# Patient Record
Sex: Female | Born: 1950 | Race: Black or African American | Hispanic: No | Marital: Single | Smoking: Never smoker
Health system: Southern US, Community
[De-identification: ages and names within clinical notes are randomized; demographics above are authoritative.]

## PROBLEM LIST (undated history)

## (undated) ENCOUNTER — Emergency Department (HOSPITAL_COMMUNITY): Admission: EM | Discharge status: Against Medical Advice | Payer: 59

## (undated) DIAGNOSIS — E7800 Pure hypercholesterolemia, unspecified: Secondary | ICD-10-CM

## (undated) DIAGNOSIS — I10 Essential (primary) hypertension: Secondary | ICD-10-CM

## (undated) DIAGNOSIS — I519 Heart disease, unspecified: Secondary | ICD-10-CM

## (undated) DIAGNOSIS — J449 Chronic obstructive pulmonary disease, unspecified: Secondary | ICD-10-CM

## (undated) HISTORY — PX: CATARACT EXTRACTION: SUR2

---

## 1998-02-03 ENCOUNTER — Ambulatory Visit (HOSPITAL_COMMUNITY): Admission: RE | Admit: 1998-02-03 | Discharge: 1998-02-03 | Payer: Self-pay | Admitting: *Deleted

## 2000-09-07 ENCOUNTER — Emergency Department (HOSPITAL_COMMUNITY): Admission: EM | Admit: 2000-09-07 | Discharge: 2000-09-07 | Payer: Self-pay | Admitting: Emergency Medicine

## 2000-09-07 ENCOUNTER — Encounter: Payer: Self-pay | Admitting: Emergency Medicine

## 2001-11-19 ENCOUNTER — Emergency Department (HOSPITAL_COMMUNITY): Admission: EM | Admit: 2001-11-19 | Discharge: 2001-11-19 | Payer: Self-pay | Admitting: Emergency Medicine

## 2002-01-01 ENCOUNTER — Ambulatory Visit (HOSPITAL_COMMUNITY): Admission: RE | Admit: 2002-01-01 | Discharge: 2002-01-01 | Payer: Self-pay | Admitting: Preventative Medicine

## 2002-01-01 ENCOUNTER — Encounter: Payer: Self-pay | Admitting: Preventative Medicine

## 2003-07-28 ENCOUNTER — Ambulatory Visit (HOSPITAL_COMMUNITY): Admission: RE | Admit: 2003-07-28 | Discharge: 2003-07-28 | Payer: Self-pay | Admitting: Family Medicine

## 2003-09-01 ENCOUNTER — Other Ambulatory Visit: Admission: RE | Admit: 2003-09-01 | Discharge: 2003-09-01 | Payer: Self-pay | Admitting: Family Medicine

## 2004-08-28 ENCOUNTER — Ambulatory Visit: Payer: Self-pay | Admitting: Family Medicine

## 2004-12-01 ENCOUNTER — Ambulatory Visit: Payer: Self-pay | Admitting: Family Medicine

## 2004-12-02 ENCOUNTER — Ambulatory Visit (HOSPITAL_COMMUNITY): Admission: RE | Admit: 2004-12-02 | Discharge: 2004-12-02 | Payer: Self-pay | Admitting: Family Medicine

## 2004-12-13 ENCOUNTER — Ambulatory Visit (HOSPITAL_COMMUNITY): Admission: RE | Admit: 2004-12-13 | Discharge: 2004-12-13 | Payer: Self-pay | Admitting: Family Medicine

## 2005-01-30 ENCOUNTER — Ambulatory Visit: Payer: Self-pay | Admitting: Family Medicine

## 2005-03-07 ENCOUNTER — Ambulatory Visit: Payer: Self-pay | Admitting: Family Medicine

## 2005-03-20 ENCOUNTER — Ambulatory Visit: Payer: Self-pay | Admitting: *Deleted

## 2005-04-02 ENCOUNTER — Ambulatory Visit: Payer: Self-pay | Admitting: *Deleted

## 2005-04-02 ENCOUNTER — Encounter (HOSPITAL_COMMUNITY): Admission: RE | Admit: 2005-04-02 | Discharge: 2005-04-02 | Payer: Self-pay | Admitting: *Deleted

## 2005-04-04 ENCOUNTER — Ambulatory Visit: Payer: Self-pay | Admitting: *Deleted

## 2005-04-11 ENCOUNTER — Ambulatory Visit: Payer: Self-pay | Admitting: *Deleted

## 2006-04-10 ENCOUNTER — Ambulatory Visit: Payer: Self-pay | Admitting: Family Medicine

## 2006-04-23 ENCOUNTER — Ambulatory Visit: Payer: Self-pay | Admitting: Family Medicine

## 2006-04-23 ENCOUNTER — Ambulatory Visit: Payer: Self-pay | Admitting: Cardiovascular Disease

## 2006-05-14 ENCOUNTER — Ambulatory Visit: Payer: Self-pay | Admitting: Cardiovascular Disease

## 2006-08-14 ENCOUNTER — Ambulatory Visit: Payer: Self-pay | Admitting: Cardiovascular Disease

## 2006-08-14 LAB — CONVERTED CEMR LAB
ALT: 27 units/L (ref 0–40)
Alkaline Phosphatase: 87 units/L (ref 39–117)
Bilirubin, Direct: 0.1 mg/dL (ref 0.0–0.3)
Cholesterol: 175 mg/dL (ref 0–200)
HDL: 39.2 mg/dL (ref >39.0)
Total CHOL/HDL Ratio: 4.5

## 2007-02-24 ENCOUNTER — Encounter: Admission: RE | Admit: 2007-02-24 | Discharge: 2007-02-24 | Payer: Self-pay | Admitting: Family Medicine

## 2007-02-28 ENCOUNTER — Encounter: Admission: RE | Admit: 2007-02-28 | Discharge: 2007-02-28 | Payer: Self-pay | Admitting: Family Medicine

## 2007-06-20 DIAGNOSIS — I10 Essential (primary) hypertension: Secondary | ICD-10-CM

## 2007-07-10 ENCOUNTER — Encounter: Payer: Self-pay | Admitting: Family Medicine

## 2010-04-27 ENCOUNTER — Ambulatory Visit (HOSPITAL_COMMUNITY): Admission: RE | Admit: 2010-04-27 | Discharge: 2010-04-27 | Payer: Self-pay | Admitting: Ophthalmology

## 2010-07-29 ENCOUNTER — Encounter: Payer: Self-pay | Admitting: Family Medicine

## 2010-08-08 NOTE — Letter (Signed)
Summary: rpc chart  rpc chart   Imported By: Curtis Sites 12/29/2009 11:36:18  _____________________________________________________________________  External Attachment:    Type:   Image     Comment:   External Document

## 2010-09-21 LAB — BASIC METABOLIC PANEL
Calcium: 9.2 mg/dL (ref 8.4–10.5)
GFR calc non Af Amer: >60 mL/min (ref >60)
Potassium: 3.5 mEq/L (ref 3.5–5.1)
Sodium: 140 mEq/L (ref 135–145)

## 2010-09-21 LAB — HEMOGLOBIN AND HEMATOCRIT, BLOOD: HCT: 40.1 % (ref 36.0–46.0)

## 2010-10-30 ENCOUNTER — Ambulatory Visit
Admission: RE | Admit: 2010-10-30 | Discharge: 2010-10-30 | Disposition: A | Discharge status: Home / Self Care | Payer: 59 | Source: Ambulatory Visit | Attending: Family Medicine | Admitting: Family Medicine

## 2010-10-30 ENCOUNTER — Other Ambulatory Visit: Payer: Self-pay | Admitting: Family Medicine

## 2010-10-30 DIAGNOSIS — R52 Pain, unspecified: Secondary | ICD-10-CM

## 2010-11-24 NOTE — Letter (Signed)
May 14, 2006    Syliva Overman, MD  [address]  [place], [place] [address]   RE:  Ariel, BROWNRIGG  MRN:  [id number]  /  DOB:  07/04/1951   Dear Dr. Lodema Hong:   I saw Ariel Martinez back in follow up today at the [facility].  As you know she is a very nice 60 year old African-American woman with  palpitations, hypertension and hypercholesterolemia who presented today for  follow up.  She has noticed a reduction in her palpitations since doubling  her Toprol and reducing her caffeine intake.  She has not had any chest  discomfort or shortness of breath. She has no other new complaints today.  Her palpitations occur most commonly with activity such as bending forward.  She continues to have them occasionally at night, but they are less frequent  than previously.   She was noted to be markedly hypertensive at the time of your office visit  back in mid October and you started her on antihypertensive therapy.  In  addition, I doubled her Toprol to treat her palpitations; and, she reports  better blood pressure control since these changes were made.   Her current medicines include Diovan HCT 320/12.5 daily, aspirin 81 mg daily  and Toprol 50 mg daily.   On exam today her blood pressure is 139/89 with a heart rate of 59.  Her  weight is 181 pounds. Respiratory rate is 16.   [name] is currently stable from a cardiovascular standpoint.  Her current  cardiovascular problems are as follows:  1. Palpitations; improved with caffeine reduction and doubling of Toprol      XL.  I think she has done well with these changes and would not      recommend any other diagnostic or therapeutic interventions today.  2. Hypertension.  She has had a good response to increasing her Toprol and      adding Diovan HCT.  Her blood pressure is borderline today, but I think      she has had a good overall response to this therapy and is much      improved from  previous.  If she has elevated blood pressures in the      future I think the addition of Norvasc would be a good choice for her      at your discretion.  3. Dyslipidemia.  The patient has a significantly elevated LDL cholesterol      at 151.  She reports being on Zocor in the past and tolerated this      well.  I would favor treating her for her dyslipidemia.  I have started      her on 40 mg of Zocor and have scheduled her for follow up lipids and      LFTs in 12 weeks.   Regarding follow up I will see her back on an as needed basis.  I will be in  contact with her when the results of her lipids and LFTs are completed, and  we will send these out to your office.   Thanks again for the opportunity to see [name].  Feel free to contact me  at anytime with questions regarding her care.    Sincerely,      Veverly Fells. Excell Seltzer, MD  Electronically Signed    MDC/MedQ  DD: 05/14/2006  DT: 05/14/2006  Job #: [id number]

## 2010-11-24 NOTE — Letter (Signed)
April 23, 2006     Milus Mallick. Lodema Hong, M.D.  [address]  [place], [place] [address]   RE:  Ariel, Martinez  MRN:  [id number]  /  DOB:  1950-10-19   Dear Dr. Lodema Hong:   It was my pleasure to see Ariel Martinez this morning as an outpatient at the  [facility].  As you know, she is a very nice 60 year old  African-American woman who presents for evaluation of palpitations and  hypertension.   Ariel Martinez has a longstanding history of palpitations.  She has had an  evaluation in the past that include a Holter Monitor back in September of  2006.  She had single PVCs that correlated with her symptoms.  She had no  atrial or ventricular sustained arrhythmias.  It was felt at that time that  she was having symptomatic PVCs.  She was started on Toprol but did not  tolerate the medication well, due to worsened palpitations.  Dr. Dorethea Clan  reduced her dose by half, and she has done relatively well since then.  Despite the Toprol, she continues to have palpitations that generally occur  at night.  They oftentimes wake her up from sleep.  She describes them as  feeling like her heart is pounding.  She does not have any associated  symptoms with her palpitations, other than feeling hot.  She also describes  rare chest pain that is non-exertional and is fleeting in nature.  It is  unchanged over time, and this is more of a chronic problem.  She has  undergone an evaluation for ischemia with a exercise Myoview that showed no  evidence of myocardial ischemia or infarction.  She had normal left  ventricular systolic function, as well as normal ventricular size.  This  study was performed on April 02, 2005.  Again, she has had no changes in  her symptoms since that time.   CURRENT MEDICATIONS:  1. Toprol XL, uncertain dose.  2. Aspirin 81 mg daily.  3. Diovan HCT 320/12.5 mg daily (This was just increased this morning).   ALLERGIES:  NONE.   PAST MEDICAL HISTORY:   Is pertinent for essential hypertension, dyslipidemia  and a partial hysterectomy back in 1986.   SOCIAL HISTORY:  The patient lives in [place].  She works in [employer]  as a Location manager.  She does not smoke cigarettes or drink alcohol.  She  drinks approximately 4 soft drinks daily that are caffeinated.  Before  exercise, she does some occasional walking but not on a regular basis.  She  is asymptomatic with her walking program.   FAMILY HISTORY:  Patient's mother died at age 75 of a stroke.  Father died  at age 37 for complications from asthma.  She has 3 brothers and 1 sister  and does not know there history well.  She does know that one of her  brothers has been hospitalized with hypertension.   REVIEW OF SYSTEMS:  A complete 12-point review of systems was performed.  Pertinent positives including fatigue, anxiety, ankle swelling, headaches.  All other systems were reviewed and are negative, except as described above.   PHYSICAL EXAMINATION:  GENERAL:  The patient is alert and oriented.  She is  in no acute distress.  VITAL SIGNS:  Her weight is 179 pounds.  Initial blood pressure was 152/96.  On my check, her blood pressure was 160/95 in her right arm and 156/95 in  the left arm.  Heart  rate is 78.  Respiratory rate is 12.  EYES:  Sclerae anicteric, conjunctivae pink.  ENT:  Moist oral mucosa.  Oropharynx is clear.  NECK:  Normal carotid upstrokes without bruits.  Jugular venous pressure is  normal.  LUNGS:  Clear to auscultation bilaterally.  CARDIOVASCULAR:  The apex is discreet and non-displaced.  HEART:  Regular rate and rhythm without murmurs or gallops.  ABDOMEN:  Soft, obese, nontender, no organomegaly, no abdominal bruits.  EXTREMITIES:  No clubbing, cyanosis or edema.  Peripheral pulses are 2+ and  equal throughout.  SKIN:  Warm and dry.  LYMPHATICS:  There is no lymphadenopathy.  NEUROLOGIC:  Motor strength is 5/5 and equal in the arms and legs   bilaterally.   EKG demonstrates normal sinus rhythm with nonspecific T-wave changes.   Recent laboratory data was reviewed from October 3rd.  This demonstrated a  normal creatinine of 0.7 and potassium of 4.1.  Patient's lipid panel showed  a cholesterol of 218 with an HDL of 45 and an LDL of 151.  Her triglyceride  level is 108.  Liver profile was within normal limits, and her TSH was  normal at 0.973.   ASSESSMENT:  Ariel Martinez is a 60 year old woman with the following  cardiovascular problems.  1. Palpitations.  I suspect her palpitations are caused by symptomatic      PVCs.  This was documented on her Holter Monitor last year, and her      symptoms persist.  She initially did not tolerate full-dose Toprol XL      and has been reduced to 1/2 tablet daily.  She has tolerated this      medication well but continues to have breakthrough symptoms.  I      suggested that she increase her Toprol back to one full pill daily, and      if this is tolerated, we may be able to titrate this medication in the      future.  I have also asked her to reduce her caffeine intake and switch      to de-caff soda.  I think this may have a significant effect on her      palpitations, as removing caffeine can produce good results.  I have      asked her to increase her exercise program and increase her fluid      intake as well.  I do not think she is at risk of any significant      arrhythmias in the setting of her normal left ventricular function and      no structural heart disease, as well as her reassuring Holter monitor      and absence of any history of syncope.  2. Hypertension.  You appropriately increased her anti-hypertensive      therapy this morning with doubling her Diovan HCT.  I have doubled her      Toprol for her palpitations as above.  I would like to see her back in      a few weeks to see how she has responded to these changes.  If she     continues to have significant hypertension, I  think that Norvasc would      be a good choice of additional therapy if necessary.  I did not want to      make this addition today, as we have already made the changes as      described above.  3. Dyslipidemia.  I think treatment  of her LDL cholesterol is warranted,      as her LDL is 151.  I will review this with her upon her followup visit      and likely add Statin therapy at that time.   Dr. Lodema Hong, thank you once again for allowing me to participate in the care  of Ariel Martinez.  Please feel free to contact me at any time with questions  regarding her care.    Sincerely,      Veverly Fells. Excell Seltzer, MD    MDC/MedQ  /  Job #:  [id number]  DD:  04/23/2006 / DT:  04/24/2006

## 2012-05-23 ENCOUNTER — Other Ambulatory Visit: Payer: Self-pay | Admitting: Family Medicine

## 2012-05-23 ENCOUNTER — Ambulatory Visit
Admission: RE | Admit: 2012-05-23 | Discharge: 2012-05-23 | Disposition: A | Discharge status: Home / Self Care | Payer: 59 | Source: Ambulatory Visit | Attending: Family Medicine | Admitting: Family Medicine

## 2012-05-23 DIAGNOSIS — M25562 Pain in left knee: Secondary | ICD-10-CM

## 2014-03-10 ENCOUNTER — Other Ambulatory Visit: Payer: Self-pay | Admitting: Family Medicine

## 2014-03-10 DIAGNOSIS — M545 Low back pain, unspecified: Secondary | ICD-10-CM

## 2014-03-10 DIAGNOSIS — Z9181 History of falling: Secondary | ICD-10-CM

## 2014-03-10 DIAGNOSIS — R0781 Pleurodynia: Secondary | ICD-10-CM

## 2014-03-11 ENCOUNTER — Ambulatory Visit
Admission: RE | Admit: 2014-03-11 | Discharge: 2014-03-11 | Disposition: A | Discharge status: Home / Self Care | Payer: 59 | Source: Ambulatory Visit | Attending: Family Medicine | Admitting: Family Medicine

## 2014-03-11 DIAGNOSIS — Z9181 History of falling: Secondary | ICD-10-CM

## 2014-03-11 DIAGNOSIS — R0781 Pleurodynia: Secondary | ICD-10-CM

## 2014-03-11 DIAGNOSIS — M545 Low back pain, unspecified: Secondary | ICD-10-CM

## 2014-04-21 ENCOUNTER — Other Ambulatory Visit: Payer: Self-pay | Admitting: Family Medicine

## 2014-05-25 ENCOUNTER — Other Ambulatory Visit: Payer: Self-pay | Admitting: Internal Medicine

## 2014-05-25 ENCOUNTER — Ambulatory Visit (INDEPENDENT_AMBULATORY_CARE_PROVIDER_SITE_OTHER): Payer: 59 | Admitting: Urology

## 2014-05-25 ENCOUNTER — Other Ambulatory Visit: Payer: Self-pay | Admitting: Urology

## 2014-05-25 ENCOUNTER — Ambulatory Visit (HOSPITAL_COMMUNITY)
Admission: RE | Admit: 2014-05-25 | Discharge: 2014-05-25 | Disposition: A | Discharge status: Home / Self Care | Payer: 59 | Source: Ambulatory Visit | Attending: Urology | Admitting: Urology

## 2014-05-25 DIAGNOSIS — N200 Calculus of kidney: Secondary | ICD-10-CM

## 2014-05-25 DIAGNOSIS — M5136 Other intervertebral disc degeneration, lumbar region: Secondary | ICD-10-CM | POA: Insufficient documentation

## 2014-05-25 DIAGNOSIS — R109 Unspecified abdominal pain: Secondary | ICD-10-CM | POA: Diagnosis present

## 2014-06-20 ENCOUNTER — Encounter (HOSPITAL_COMMUNITY): Payer: Self-pay | Admitting: *Deleted

## 2014-06-20 ENCOUNTER — Inpatient Hospital Stay (HOSPITAL_COMMUNITY)
Admission: EM | Admit: 2014-06-20 | Discharge: 2014-06-22 | DRG: 189 | Disposition: A | Discharge status: Home / Self Care | Payer: 59 | Attending: Internal Medicine | Admitting: Internal Medicine

## 2014-06-20 ENCOUNTER — Inpatient Hospital Stay (HOSPITAL_COMMUNITY): Payer: 59

## 2014-06-20 ENCOUNTER — Emergency Department (HOSPITAL_COMMUNITY): Payer: 59

## 2014-06-20 DIAGNOSIS — J9601 Acute respiratory failure with hypoxia: Secondary | ICD-10-CM | POA: Diagnosis present

## 2014-06-20 DIAGNOSIS — J209 Acute bronchitis, unspecified: Secondary | ICD-10-CM

## 2014-06-20 DIAGNOSIS — E1165 Type 2 diabetes mellitus with hyperglycemia: Secondary | ICD-10-CM | POA: Diagnosis present

## 2014-06-20 DIAGNOSIS — R0602 Shortness of breath: Secondary | ICD-10-CM

## 2014-06-20 DIAGNOSIS — J441 Chronic obstructive pulmonary disease with (acute) exacerbation: Secondary | ICD-10-CM | POA: Diagnosis present

## 2014-06-20 DIAGNOSIS — Z79899 Other long term (current) drug therapy: Secondary | ICD-10-CM | POA: Diagnosis not present

## 2014-06-20 DIAGNOSIS — E876 Hypokalemia: Secondary | ICD-10-CM | POA: Diagnosis present

## 2014-06-20 DIAGNOSIS — R739 Hyperglycemia, unspecified: Secondary | ICD-10-CM

## 2014-06-20 DIAGNOSIS — R0600 Dyspnea, unspecified: Secondary | ICD-10-CM | POA: Diagnosis present

## 2014-06-20 DIAGNOSIS — R9431 Abnormal electrocardiogram [ECG] [EKG]: Secondary | ICD-10-CM | POA: Diagnosis present

## 2014-06-20 DIAGNOSIS — F419 Anxiety disorder, unspecified: Secondary | ICD-10-CM | POA: Diagnosis present

## 2014-06-20 DIAGNOSIS — I10 Essential (primary) hypertension: Secondary | ICD-10-CM | POA: Diagnosis present

## 2014-06-20 DIAGNOSIS — R0789 Other chest pain: Secondary | ICD-10-CM

## 2014-06-20 DIAGNOSIS — J9600 Acute respiratory failure, unspecified whether with hypoxia or hypercapnia: Secondary | ICD-10-CM | POA: Diagnosis present

## 2014-06-20 HISTORY — DX: Pure hypercholesterolemia, unspecified: E78.00

## 2014-06-20 HISTORY — DX: Essential (primary) hypertension: I10

## 2014-06-20 HISTORY — DX: Heart disease, unspecified: I51.9

## 2014-06-20 LAB — COMPREHENSIVE METABOLIC PANEL
ALBUMIN: 4.1 g/dL (ref 3.5–5.2)
ALK PHOS: 111 U/L (ref 39–117)
ALT: 17 U/L (ref 0–35)
ANION GAP: 17 — AB (ref 5–15)
AST: 23 U/L (ref 0–37)
BUN: 13 mg/dL (ref 6–23)
CALCIUM: 9.5 mg/dL (ref 8.4–10.5)
CHLORIDE: 97 meq/L (ref 96–112)
CO2: 25 mEq/L (ref 19–32)
Creatinine, Ser: 0.66 mg/dL (ref 0.50–1.10)
GFR calc Af Amer: >90 mL/min (ref >90)
GFR calc non Af Amer: >90 mL/min (ref >90)
Glucose, Bld: 194 mg/dL — ABNORMAL HIGH (ref 70–99)
POTASSIUM: 3.4 meq/L — AB (ref 3.7–5.3)
SODIUM: 139 meq/L (ref 137–147)
Total Bilirubin: 0.2 mg/dL — ABNORMAL LOW (ref 0.3–1.2)
Total Protein: 8.6 g/dL — ABNORMAL HIGH (ref 6.0–8.3)

## 2014-06-20 LAB — MRSA PCR SCREENING: MRSA BY PCR: NEGATIVE

## 2014-06-20 LAB — CBG MONITORING, ED
Glucose-Capillary: 370 mg/dL — ABNORMAL HIGH (ref 70–99)
Glucose-Capillary: 344 mg/dL — ABNORMAL HIGH (ref 70–99)

## 2014-06-20 LAB — CBC WITH DIFFERENTIAL/PLATELET
BASOS PCT: 1 % (ref 0–1)
Basophils Absolute: 0.1 10*3/uL (ref 0.0–0.1)
Eosinophils Absolute: 0.9 10*3/uL — ABNORMAL HIGH (ref 0.0–0.7)
Eosinophils Relative: 8 % — ABNORMAL HIGH (ref 0–5)
HEMATOCRIT: 44 % (ref 36.0–46.0)
Hemoglobin: 14.5 g/dL (ref 12.0–15.0)
Lymphocytes Relative: 31 % (ref 12–46)
Lymphs Abs: 3.5 10*3/uL (ref 0.7–4.0)
MCH: 30.9 pg (ref 26.0–34.0)
MCHC: 33 g/dL (ref 30.0–36.0)
MCV: 93.6 fL (ref 78.0–100.0)
MONOS PCT: 6 % (ref 3–12)
Monocytes Absolute: 0.7 10*3/uL (ref 0.1–1.0)
NEUTROS PCT: 54 % (ref 43–77)
Neutro Abs: 6.4 10*3/uL (ref 1.7–7.7)
PLATELETS: 265 10*3/uL (ref 150–400)
RBC: 4.7 MIL/uL (ref 3.87–5.11)
RDW: 12.9 % (ref 11.5–15.5)
WBC: 11.5 10*3/uL — ABNORMAL HIGH (ref 4.0–10.5)

## 2014-06-20 LAB — GLUCOSE, CAPILLARY
Glucose-Capillary: 214 mg/dL — ABNORMAL HIGH (ref 70–99)
Glucose-Capillary: 196 mg/dL — ABNORMAL HIGH (ref 70–99)

## 2014-06-20 LAB — HEMOGLOBIN A1C
Hgb A1c MFr Bld: 6.5 % — ABNORMAL HIGH (ref <5.7)
Mean Plasma Glucose: 140 mg/dL — ABNORMAL HIGH (ref <117)

## 2014-06-20 LAB — TROPONIN I: Troponin I: <0.3 ng/mL (ref <0.30)

## 2014-06-20 LAB — PROCALCITONIN: Procalcitonin: <0.1 ng/mL

## 2014-06-20 LAB — PRO B NATRIURETIC PEPTIDE: PRO B NATRI PEPTIDE: 13.6 pg/mL (ref 0–125)

## 2014-06-20 LAB — STREP PNEUMONIAE URINARY ANTIGEN: Strep Pneumo Urinary Antigen: NEGATIVE

## 2014-06-20 MED ORDER — ENOXAPARIN SODIUM 40 MG/0.4ML ~~LOC~~ SOLN
40.0000 mg | SUBCUTANEOUS | Status: DC
  Administered 2014-06-20 – 2014-06-21 (×2): 40 mg via SUBCUTANEOUS
  Filled 2014-06-20 (×4): qty 0.4

## 2014-06-20 MED ORDER — ACETAMINOPHEN 325 MG PO TABS: 650.0000 mg | ORAL_TABLET | Freq: Four times a day (QID) | ORAL | Status: DC | PRN

## 2014-06-20 MED ORDER — ALBUTEROL (5 MG/ML) CONTINUOUS INHALATION SOLN: 10.0000 mg/h | INHALATION_SOLUTION | RESPIRATORY_TRACT | Status: DC

## 2014-06-20 MED ORDER — LEVOFLOXACIN IN D5W 750 MG/150ML IV SOLN
750.0000 mg | Freq: Once | INTRAVENOUS | Status: AC
  Administered 2014-06-20: 750 mg via INTRAVENOUS
  Filled 2014-06-20: qty 150

## 2014-06-20 MED ORDER — AMLODIPINE-OLMESARTAN 10-40 MG PO TABS: 1.0000 | ORAL_TABLET | Freq: Every day | ORAL | Status: DC

## 2014-06-20 MED ORDER — MAGNESIUM SULFATE 2 GM/50ML IV SOLN
2.0000 g | Freq: Once | INTRAVENOUS | Status: AC
  Administered 2014-06-20: 2 g via INTRAVENOUS
  Filled 2014-06-20: qty 50

## 2014-06-20 MED ORDER — METHYLPREDNISOLONE SODIUM SUCC 125 MG IJ SOLR
80.0000 mg | Freq: Four times a day (QID) | INTRAMUSCULAR | Status: DC
  Administered 2014-06-20 – 2014-06-21 (×5): 80 mg via INTRAVENOUS
  Filled 2014-06-20 (×6): qty 2

## 2014-06-20 MED ORDER — POTASSIUM CHLORIDE 10 MEQ/100ML IV SOLN
10.0000 meq | INTRAVENOUS | Status: AC
  Administered 2014-06-20 (×2): 10 meq via INTRAVENOUS
  Filled 2014-06-20 (×2): qty 100

## 2014-06-20 MED ORDER — IRBESARTAN 300 MG PO TABS
300.0000 mg | ORAL_TABLET | Freq: Every day | ORAL | Status: DC
  Administered 2014-06-20: 300 mg via ORAL
  Filled 2014-06-20 (×2): qty 1

## 2014-06-20 MED ORDER — POTASSIUM CHLORIDE CRYS ER 10 MEQ PO TBCR
10.0000 meq | EXTENDED_RELEASE_TABLET | Freq: Every day | ORAL | Status: DC
  Administered 2014-06-20 – 2014-06-22 (×3): 10 meq via ORAL
  Filled 2014-06-20 (×3): qty 1

## 2014-06-20 MED ORDER — TORSEMIDE 20 MG PO TABS
20.0000 mg | ORAL_TABLET | Freq: Every day | ORAL | Status: DC
  Administered 2014-06-20 – 2014-06-21 (×2): 20 mg via ORAL
  Filled 2014-06-20 (×2): qty 1

## 2014-06-20 MED ORDER — METOPROLOL SUCCINATE ER 25 MG PO TB24
25.0000 mg | ORAL_TABLET | Freq: Every day | ORAL | Status: DC
  Administered 2014-06-20 – 2014-06-22 (×3): 25 mg via ORAL
  Filled 2014-06-20 (×3): qty 1

## 2014-06-20 MED ORDER — ONDANSETRON HCL 4 MG/2ML IJ SOLN: 4.0000 mg | Freq: Four times a day (QID) | INTRAMUSCULAR | Status: DC | PRN

## 2014-06-20 MED ORDER — BUDESONIDE 0.25 MG/2ML IN SUSP
0.2500 mg | Freq: Two times a day (BID) | RESPIRATORY_TRACT | Status: DC
  Administered 2014-06-20 – 2014-06-22 (×5): 0.25 mg via RESPIRATORY_TRACT
  Filled 2014-06-20 (×5): qty 2

## 2014-06-20 MED ORDER — MORPHINE SULFATE 2 MG/ML IJ SOLN
2.0000 mg | Freq: Once | INTRAMUSCULAR | Status: AC
  Administered 2014-06-20: 2 mg via INTRAVENOUS
  Filled 2014-06-20: qty 1

## 2014-06-20 MED ORDER — MORPHINE SULFATE 2 MG/ML IJ SOLN: 2.0000 mg | INTRAMUSCULAR | Status: DC | PRN

## 2014-06-20 MED ORDER — ONDANSETRON HCL 4 MG PO TABS: 4.0000 mg | ORAL_TABLET | Freq: Four times a day (QID) | ORAL | Status: DC | PRN

## 2014-06-20 MED ORDER — METHYLPREDNISOLONE SODIUM SUCC 125 MG IJ SOLR
125.0000 mg | Freq: Once | INTRAMUSCULAR | Status: AC
  Administered 2014-06-20: 125 mg via INTRAVENOUS
  Filled 2014-06-20: qty 2

## 2014-06-20 MED ORDER — LORAZEPAM 0.5 MG PO TABS
0.5000 mg | ORAL_TABLET | Freq: Once | ORAL | Status: AC
  Administered 2014-06-20: 0.5 mg via ORAL
  Filled 2014-06-20: qty 1

## 2014-06-20 MED ORDER — SIMVASTATIN 20 MG PO TABS
20.0000 mg | ORAL_TABLET | Freq: Every day | ORAL | Status: DC
  Administered 2014-06-20 – 2014-06-21 (×2): 20 mg via ORAL
  Filled 2014-06-20: qty 1
  Filled 2014-06-20: qty 2
  Filled 2014-06-20: qty 1
  Filled 2014-06-20: qty 2

## 2014-06-20 MED ORDER — INSULIN ASPART 100 UNIT/ML ~~LOC~~ SOLN
0.0000 [IU] | Freq: Three times a day (TID) | SUBCUTANEOUS | Status: DC
Start: 2014-06-20 — End: 2014-06-21
  Administered 2014-06-20: 9 [IU] via SUBCUTANEOUS
  Administered 2014-06-20: 3 [IU] via SUBCUTANEOUS
  Administered 2014-06-21: 2 [IU] via SUBCUTANEOUS
  Filled 2014-06-20: qty 1

## 2014-06-20 MED ORDER — LEVOFLOXACIN IN D5W 750 MG/150ML IV SOLN
750.0000 mg | INTRAVENOUS | Status: DC
  Administered 2014-06-21: 750 mg via INTRAVENOUS
  Filled 2014-06-20 (×2): qty 150

## 2014-06-20 MED ORDER — ASPIRIN 325 MG PO TABS
325.0000 mg | ORAL_TABLET | Freq: Every day | ORAL | Status: DC
  Administered 2014-06-20 – 2014-06-22 (×3): 325 mg via ORAL
  Filled 2014-06-20 (×3): qty 1

## 2014-06-20 MED ORDER — CETYLPYRIDINIUM CHLORIDE 0.05 % MT LIQD
7.0000 mL | Freq: Two times a day (BID) | OROMUCOSAL | Status: DC
  Administered 2014-06-20 – 2014-06-22 (×4): 7 mL via OROMUCOSAL

## 2014-06-20 MED ORDER — IOHEXOL 350 MG/ML SOLN
100.0000 mL | Freq: Once | INTRAVENOUS | Status: AC | PRN
  Administered 2014-06-20: 100 mL via INTRAVENOUS

## 2014-06-20 MED ORDER — ACETAMINOPHEN 650 MG RE SUPP: 650.0000 mg | Freq: Four times a day (QID) | RECTAL | Status: DC | PRN

## 2014-06-20 MED ORDER — IPRATROPIUM-ALBUTEROL 0.5-2.5 (3) MG/3ML IN SOLN
3.0000 mL | RESPIRATORY_TRACT | Status: DC
  Administered 2014-06-20 – 2014-06-21 (×8): 3 mL via RESPIRATORY_TRACT
  Filled 2014-06-20 (×8): qty 3

## 2014-06-20 MED ORDER — MAGNESIUM SULFATE 2 GM/50ML IV SOLN
2.0000 g | Freq: Once | INTRAVENOUS | Status: DC
  Filled 2014-06-20: qty 50

## 2014-06-20 MED ORDER — SODIUM CHLORIDE 0.9 % IV SOLN
Freq: Once | INTRAVENOUS | Status: AC
  Administered 2014-06-20: 09:00:00 via INTRAVENOUS

## 2014-06-20 MED ORDER — AMLODIPINE BESYLATE 10 MG PO TABS
10.0000 mg | ORAL_TABLET | Freq: Every day | ORAL | Status: DC
  Administered 2014-06-20: 10 mg via ORAL
  Filled 2014-06-20: qty 1

## 2014-06-20 MED ORDER — SODIUM CHLORIDE 0.9 % IJ SOLN
3.0000 mL | Freq: Two times a day (BID) | INTRAMUSCULAR | Status: DC
  Administered 2014-06-21 – 2014-06-22 (×3): 3 mL via INTRAVENOUS

## 2014-06-20 MED ORDER — ALBUTEROL (5 MG/ML) CONTINUOUS INHALATION SOLN
10.0000 mg/h | INHALATION_SOLUTION | RESPIRATORY_TRACT | Status: DC
  Administered 2014-06-20: 10 mg/h via RESPIRATORY_TRACT
  Filled 2014-06-20: qty 20

## 2014-06-20 MED ORDER — ALBUTEROL (5 MG/ML) CONTINUOUS INHALATION SOLN
10.0000 mg/h | INHALATION_SOLUTION | Freq: Once | RESPIRATORY_TRACT | Status: AC
  Administered 2014-06-20: 10 mg/h via RESPIRATORY_TRACT
  Filled 2014-06-20: qty 20

## 2014-06-20 NOTE — ED Notes (Signed)
Notified Dr Lynelle DoctorKnapp that pt is still tripoding and not moving much air post neb and still fells like she cannot breathe. EDP at bedside

## 2014-06-20 NOTE — Progress Notes (Signed)
Pt tolerating BiPap at this time.

## 2014-06-20 NOTE — H&P (Signed)
History and Physical  Ariel InaBrenda A Martinez WGN:[id number] DOB: [birth year] DOA: 06/20/2014   PCP: Evlyn CourierHILL,GERALD K, MD   Chief Complaint: dyspnea  HPI:  63 y/o female with hx of HTN, HLD presents with one-week history of shortness of breath that has progressively worsened in the past 24 hours. The patient had a colonoscopy performed on 06/14/2014. She began developing shortness of breath on 06/15/2014. She went to see her primary care provider who gave her some anti-tussive meds. The patient's cough improved, but she became progressively shortness of breath as the week went along. The patient was called and an albuterol inhaler on the day prior to admission. She used it, and it did not help. Her breathing got worse. As a result the patient came to the emergency department. The patient was noted to be hypoxemic and was placed on a nonrebreather. BiPAP was attempted, but the patient was not able to tolerate it. After Solu-Medrol and 1 hour-long albuterol nebulizer, the patient is feeling somewhat better, but she remains quite dyspneic. The patient also began complaining of chest pressure that began in the past 24 hours 100 breathing got worse. She denies any radiation, nausea, vomiting, diarrhea, abdominal pain, dysuria. She has not had any fevers or chills or hemoptysis. The patient has never smoked in her life. She has not had any new changes to her medications except for the cough medication and albuterol. The patient states that she was told that she had asthma in the distant past and has used steroids in the past with similar episodes of respiratory failure. Assessment/Plan: Acute respiratory failure -Suspect underlying undiagnosed obstructive lung disease -IV Solu-Medrol -Aerosolized albuterol and Atrovent every 4 hours -Intravenous levofloxacin -Pulmonary hygiene -Pulmonary consultation if no improvement -Intravenous morphine for anxiety and chest discomfort when necessary Abnormal chest  x-ray -Suspect the patient may be developing CAP given increased right lower lobe opacity and leukocytosis -Levofloxacin started -Urine legionella antigen, urine Streptococcus pneumoniae antigen Chest pain with abnormal EKG -Cycle troponins -Start aspirin -Continue metoprolol succinate Hypertension -Continue amlodipine and ARB -Continue metoprolol succinate Lower extremity edema -Venous duplex rule out DVT -Continue home dose torsemide -ProBNP 13.6 Hypokalemia -Replete -Patient was given 2 g magnesium IV in the emergency department -Check magnesium in the morning Hyperglycemia  -NovoLog sliding scale  -Hemoglobin A1c       Past Medical History  Diagnosis Date  . Hypertension   . High cholesterol   . Heart disease    Past Surgical History  Procedure Laterality Date  . Cataract extraction     Social History:  reports that she has never smoked. She does not have any smokeless tobacco history on file. She reports that she does not drink alcohol or use illicit drugs.   History reviewed. No pertinent family history.   No Known Allergies    Prior to Admission medications   Medication Sig Start Date End Date Taking? Authorizing Provider  albuterol (PROVENTIL HFA;VENTOLIN HFA) 108 (90 BASE) MCG/ACT inhaler Inhale 2 puffs into the lungs every 4 (four) hours as needed for wheezing or shortness of breath.   Yes Historical Provider, MD  AZOR 10-40 MG per tablet Take 1 tablet by mouth daily. 03/24/14  Yes Historical Provider, MD  ibuprofen (ADVIL,MOTRIN) 200 MG tablet Take 200 mg by mouth every 6 (six) hours as needed (for pain.).   Yes Historical Provider, MD  metoprolol succinate (TOPROL-XL) 25 MG 24 hr tablet Take 25 mg by mouth daily. 04/09/14  Yes Historical Provider, MD  simvastatin (ZOCOR) 20 MG tablet Take 20 mg by mouth daily.   Yes Historical Provider, MD  KLOR-CON M10 10 MEQ tablet Take 10 mEq by mouth daily. 03/24/14   Historical Provider, MD  torsemide (DEMADEX) 20  MG tablet Take 20 mg by mouth daily. 04/09/14   Historical Provider, MD    Review of Systems:  Constitutional:  No weight loss, night sweats,  Head&Eyes: No headache.  No vision loss. ENT:  No Difficulty swallowing,Tooth/dental problems,Sore throat,  Cardio-vascular:  No  Orthopnea, PND, swelling in lower extremities,  dizziness, palpitations  GI:  No  abdominal pain, nausea, vomiting, diarrhea, loss of appetite, hematochezia, melena Resp:  No coughing up of blood .No wheezing. Skin:  no rash or lesions.  GU:  no dysuria, change in color of urine, no urgency or frequency Musculoskeletal:  No joint pain or swelling. No decreased range of motion. No back pain.  Psych:  No change in mood or affect.  Neurologic: No headache, no dysesthesia, no focal weakness, no vision loss. No syncope  Physical Exam: Filed Vitals:   06/20/14 0850 06/20/14 0910 06/20/14 0925 06/20/14 0927  BP: 125/71  152/70   Pulse: 124 131 122   Temp:      TempSrc:      Resp: 16 31 27    SpO2: 93% 100% 98% 99%   General:  A&O x 3,  Pleasant/cooperative, mild extremis Head/Eye: No conjunctival hemorrhage, no icterus, [place]/AT, No nystagmus ENT:  No icterus,  No thrush, good dentition, no pharyngeal exudate Neck:  No masses, no lymphadenpathy, no bruits CV:  RRR, no rub, no gallop, no S3 Lung:  Diffuse wheezing bilateral with bibasilar rales. Abdomen: soft/NT, +BS, nondistended, no peritoneal signs Ext: No cyanosis, No rashes, No petechiae, No lymphangitis, 1+LE edema   Labs on Admission:  Basic Metabolic Panel:  Recent Labs Lab 06/20/14 0645  NA 139  K 3.4*  CL 97  CO2 25  GLUCOSE 194*  BUN 13  CREATININE 0.66  CALCIUM 9.5   Liver Function Tests:  Recent Labs Lab 06/20/14 0645  AST 23  ALT 17  ALKPHOS 111  BILITOT 0.2*  PROT 8.6*  ALBUMIN 4.1   No results for input(s): LIPASE, AMYLASE in the last 168 hours. No results for input(s): AMMONIA in the last 168 hours. CBC:  Recent  Labs Lab 06/20/14 0645  WBC 11.5*  NEUTROABS 6.4  HGB 14.5  HCT 44.0  MCV 93.6  PLT 265   Cardiac Enzymes:  Recent Labs Lab 06/20/14 0645  TROPONINI <0.30   BNP: Invalid input(s): POCBNP CBG: No results for input(s): GLUCAP in the last 168 hours.  Radiological Exams on Admission: Dg Chest Port 1 View  06/20/2014   CLINICAL DATA:  63 year old female with shortness of breath  EXAM: PORTABLE CHEST - 1 VIEW  COMPARISON:  Prior chest x-ray 03/11/2014  FINDINGS: Cardiac and mediastinal contours are within normal limits. Very low inspiratory volumes with increasing patchy opacity in the bilateral bases. Similar but less conspicuous opacity was present in the right lung base on the prior chest x-ray. There is mild pulmonary vascular congestion bordering on edema. No pneumothorax. No definite pleural effusion. No acute osseous abnormality.  IMPRESSION: 1. Low inspiratory volumes with patchy bibasilar opacities which may reflect atelectasis superimposed on underlying scarring or infiltrates. Given the presence of a mild bibasilar scarring on prior imaging, atelectasis superimposed on chronic changes is favored. 2. Pulmonary vascular congestion bordering on mild edema.   Electronically Signed   By:  Malachy Moan M.D.   On: 06/20/2014 07:42    EKG: Independently reviewed. Sinus tachycardia, ST depression V3-V6   Time spent:70 minutes Code Status:   FULL Family Communication:   Daughter updated at bedside   Elandra Powell, DO  [facility] Pager [phone]  If 7PM-7AM, please contact night-coverage [email or web address] Password [facility] 06/20/2014, 9:48 AM

## 2014-06-20 NOTE — ED Notes (Signed)
All of pts belongings have been given to pts friend with pts agreement, including phone, purse, meds, and clothing.

## 2014-06-20 NOTE — ED Notes (Signed)
On Monday pt states she had a colonoscopy and when she woke up she was very hoarse, throat was dry, and it was hard to talk. Pt states she saw her doctor on Tuesday who prescribed her a preventil inhaler for the hoarseness and productive cough (yellow sputum). Respiratory distress has gotten worse and inhaler has not helped per pt. EMS reports upon their arrival she was outside of her home in moderate distress, O2 99% on room air , with wheezing in all fields. Pt given 10mg  of albuterol, 1mg  atrovent, and 125mg  solu-medrol. Pt arrived with audible expiratory and inspiratory wheezing.

## 2014-06-20 NOTE — ED Notes (Signed)
RT notified about bipap

## 2014-06-20 NOTE — ED Notes (Signed)
Pt continues to c/o CP and difficulty breathing. Charge RN notified and advised to move pt to Res B. RT notified to bring Bipap. Pt A&O

## 2014-06-20 NOTE — ED Provider Notes (Signed)
CSN: [id number]     Arrival date & time 06/20/14  [id number] History  First MD Initiated Contact with Patient 06/20/14 [phone]     Chief Complaint  Patient presents with  . Respiratory Distress   Patient is a 63 y.o. female presenting with shortness of breath. The history is provided by the patient.  Shortness of Breath Severity:  Severe Onset quality:  Gradual Duration:  1 week Timing:  Constant Progression:  Worsening Chronicity:  Recurrent Relieved by:  Nothing Worsened by:  Activity Ineffective treatments:  Inhaler Associated symptoms: cough, sputum production and wheezing   Associated symptoms: no abdominal pain, no chest pain, no fever and no vomiting   Pt went to her doctors office on Monday. She was given some cough medications.  She asked if she needed steroids but her doctor did not think she needed them at that time. Throughout the week it got worse and she started getting more short of breath.  She called her doctor and was given an inhaler on Saturday but she thinks if anything she got worse.  No past medical history on file. No past surgical history on file. No family history on file. History  Substance Use Topics  . Smoking status: Not on file  . Smokeless tobacco: Not on file  . Alcohol Use: Not on file   OB History    No data available     Review of Systems  Constitutional: Negative for fever.  Respiratory: Positive for cough, sputum production, shortness of breath and wheezing.   Cardiovascular: Negative for chest pain.  Gastrointestinal: Negative for vomiting and abdominal pain.  All other systems reviewed and are negative.     Allergies  Review of patient's allergies indicates no known allergies.  Home Medications   Prior to Admission medications   Medication Sig Start Date End Date Taking? Authorizing Provider  albuterol (PROVENTIL HFA;VENTOLIN HFA) 108 (90 BASE) MCG/ACT inhaler Inhale 2 puffs into the lungs every 4 (four) hours as needed for wheezing  or shortness of breath.   Yes Historical Provider, MD  AZOR 10-40 MG per tablet Take 1 tablet by mouth daily. 03/24/14  Yes Historical Provider, MD  ibuprofen (ADVIL,MOTRIN) 200 MG tablet Take 200 mg by mouth every 6 (six) hours as needed (for pain.).   Yes Historical Provider, MD  metoprolol succinate (TOPROL-XL) 25 MG 24 hr tablet Take 25 mg by mouth daily. 04/09/14  Yes Historical Provider, MD  simvastatin (ZOCOR) 20 MG tablet Take 20 mg by mouth daily.   Yes Historical Provider, MD  KLOR-CON M10 10 MEQ tablet Take 10 mEq by mouth daily. 03/24/14   Historical Provider, MD  torsemide (DEMADEX) 20 MG tablet Take 20 mg by mouth daily. 04/09/14   Historical Provider, MD   BP 164/89 mmHg  Pulse 128  Temp(Src) 97.7 F (36.5 C) (Axillary)  Resp 16  SpO2 94% Physical Exam  Constitutional: She appears well-developed. She appears distressed.  HENT:  Head: Normocephalic and atraumatic.  Right Ear: External ear normal.  Left Ear: External ear normal.  Eyes: Conjunctivae are normal. Right eye exhibits no discharge. Left eye exhibits no discharge. No scleral icterus.  Neck: Neck supple. No JVD present. No tracheal deviation present.  Cardiovascular: Regular rhythm and intact distal pulses.  Tachycardia present.   Pulmonary/Chest: Accessory muscle usage present. No stridor. Tachypnea noted. She has wheezes. She has no rales.  Abdominal: Soft. Bowel sounds are normal. She exhibits no distension. There is no tenderness. There is no rebound and  no guarding.  Musculoskeletal: She exhibits edema (trace edema ankles). She exhibits no tenderness.  Neurological: She is alert. She has normal strength. No cranial nerve deficit (no facial droop, extraocular movements intact, no slurred speech) or sensory deficit. She exhibits normal muscle tone. She displays no seizure activity. Coordination normal.  Skin: Skin is warm and dry. No rash noted.  Psychiatric: She has a normal mood and affect.  Nursing note and vitals  reviewed.   ED Course  Procedures (including critical care time) CRITICAL CARE Performed by: ZOXWR,UEA Total critical care time: 40 Critical care time was exclusive of separately billable procedures and treating other patients. Critical care was necessary to treat or prevent imminent or life-threatening deterioration. Critical care was time spent personally by me on the following activities: development of treatment plan with patient and/or surrogate as well as nursing, discussions with consultants, evaluation of patient's response to treatment, examination of patient, obtaining history from patient or surrogate, ordering and performing treatments and interventions, ordering and review of laboratory studies, ordering and review of radiographic studies, pulse oximetry and re-evaluation of patient's condition.  Labs Review Labs Reviewed  CBC WITH DIFFERENTIAL - Abnormal; Notable for the following:    WBC 11.5 (*)    Eosinophils Relative 8 (*)    Eosinophils Absolute 0.9 (*)    All other components within normal limits  COMPREHENSIVE METABOLIC PANEL - Abnormal; Notable for the following:    Potassium 3.4 (*)    Glucose, Bld 194 (*)    Total Protein 8.6 (*)    Total Bilirubin 0.2 (*)    Anion gap 17 (*)    All other components within normal limits  TROPONIN I  PRO B NATRIURETIC PEPTIDE    Imaging Review Dg Chest Port 1 View  06/20/2014   CLINICAL DATA:  63 year old female with shortness of breath  EXAM: PORTABLE CHEST - 1 VIEW  COMPARISON:  Prior chest x-ray 03/11/2014  FINDINGS: Cardiac and mediastinal contours are within normal limits. Very low inspiratory volumes with increasing patchy opacity in the bilateral bases. Similar but less conspicuous opacity was present in the right lung base on the prior chest x-ray. There is mild pulmonary vascular congestion bordering on edema. No pneumothorax. No definite pleural effusion. No acute osseous abnormality.  IMPRESSION: 1. Low inspiratory  volumes with patchy bibasilar opacities which may reflect atelectasis superimposed on underlying scarring or infiltrates. Given the presence of a mild bibasilar scarring on prior imaging, atelectasis superimposed on chronic changes is favored. 2. Pulmonary vascular congestion bordering on mild edema.   Electronically Signed   By: Malachy Moan M.D.   On: 06/20/2014 07:42     EKG Interpretation   Date/Time:  Sunday June 20 2014 07:07:01 EST Ventricular Rate:  103 PR Interval:  155 QRS Duration: 72 QT Interval:  360 QTC Calculation: 471 R Axis:   43 Text Interpretation:  Sinus tachycardia Low voltage, precordial leads  Abnormal R-wave progression, early transition No significant change since  last tracing Confirmed by Darron Stuck  MD-J, Huntley Knoop ([id number]) on 06/20/2014 8:03:50  AM    EKG Interpretation  Date/Time:  Sunday June 20 2014 08:46:26 EST Ventricular Rate:  124 PR Interval:  149 QRS Duration: 75 QT Interval:  340 QTC Calculation: 488 R Axis:   37 Text Interpretation:  Sinus tachycardia Multiform ventricular premature complexes Low voltage, precordial leads Abnormal R-wave progression, early transition Minimal ST depression, diffuse leads , lateral changes increased Borderline prolonged QT interval Confirmed by Trafton Roker  MD-J, Christabel Camire ([id number])  on 06/20/2014 8:51:38 AM       Medications  albuterol (PROVENTIL,VENTOLIN) solution continuous neb (0 mg/hr Nebulization Stopped 06/20/14 0819)  magnesium sulfate IVPB 2 g 50 mL (2 g Intravenous New Bag/Given 06/20/14 0807)  methylPREDNISolone sodium succinate (SOLU-MEDROL) 125 mg/2 mL injection 125 mg (125 mg Intravenous Given 06/20/14 0810)      MDM   Final diagnoses:  Bronchitis, acute, with bronchospasm    0834  patient was reexamined after her hour-long nebulizer treatment. She continues to be tachypnea With labored breathing. There is decreased air movement bilaterally and end expiratory wheezing. Patient is not feeling any  better. She's having chest heaviness now. I will repeat EKG. Magnesium was ordered by Dr Ranae PalmsYelverton earlier.  Will start Bipap.  Continue to monitor closely. [id number] Subtle st changes.  Will continue to monitor and cycle cardiac enzymes. [id number]  We tried bipap but the patient became very anxious and could not tolerate it.  Started her back on face mask oxygen.  She is better although still with wheezing.  Will give a dose of morphine.  Start another neb.  Admit to stepdown unit    Linwood DibblesJon Daylee Delahoz, MD 06/20/14 [phone]

## 2014-06-20 NOTE — ED Notes (Signed)
Continuous neb finished, pt placed on nasal cannula to see how pt will tolerate.

## 2014-06-20 NOTE — ED Notes (Signed)
1st run of potassium infused. Rate 75ml due to pt complaining of her arm burning.

## 2014-06-20 NOTE — ED Notes (Signed)
hospitalist at bedside

## 2014-06-20 NOTE — Progress Notes (Signed)
Attempted to place Pt on BiPap. Pt did not tolerate, stating "I can't breath".BiPap removed.  MD at bedside. Giving CAT nebulizer at this time.

## 2014-06-20 NOTE — ED Notes (Addendum)
Pt reports her chest in hurting,  hospitalist at bedside and made aware

## 2014-06-20 NOTE — ED Notes (Signed)
Bed: WA06 Expected date: 06/20/14 Expected time: 6:03 AM Means of arrival: Ambulance Comments: Short of breath

## 2014-06-20 NOTE — ED Notes (Signed)
Pt o2 dropped to 88% on RA after neb was d/c'd. Pt placed on 4L and is now 95%. Dr Lynelle DoctorKnapp aware

## 2014-06-20 NOTE — Progress Notes (Signed)
Transported Pt to ICU on BiPap  Fi02 = .25 Pt tolerated well.

## 2014-06-20 NOTE — ED Notes (Signed)
RT at bedside to transport pt upstairs.

## 2014-06-20 NOTE — ED Notes (Signed)
RT called and notified of pt in respiratory distress.

## 2014-06-20 NOTE — ED Notes (Signed)
Report given to Judeth CornfieldStephanie, charge nurse

## 2014-06-20 NOTE — ED Notes (Signed)
Pt became worked up with CT came to get her for test. Pt came back from CT and stats were briefly 88 % on 4 L [place]. Pt placed on non rebreather. RT at bedside and pt agreed to be put on Bipap.

## 2014-06-20 NOTE — ED Notes (Signed)
Pt sitting up in chair due to not being able to sit up straight enough to breath well.

## 2014-06-20 NOTE — ED Notes (Signed)
Pt moved to res B room. Pt had colonoscopy on Monday. Pt came in to ED for resp distress. Pt sitting in tripod position. R 35. md at bedside, pt is not being placed on bipap at this moment. Receiving another continuous neb.

## 2014-06-20 NOTE — Progress Notes (Signed)
Pt stable with no noticeable increase in WOB.  Pt's RR currently 19 breaths/min and HR of 103 bpm.  Pt maintaining good SpO2 and states she feels she doesn't need the BiPAP at this time and she is feeling much better.  BiPAP not indicated at this time.

## 2014-06-20 NOTE — ED Notes (Signed)
Pt to CT

## 2014-06-20 NOTE — ED Provider Notes (Signed)
Medical screening exam:  Patient with 6 days of progressive cough and shortness of breath. Said the cough has been productive yellow sputum. Seen by her primary Dr. yesterday and given albuterol inhaler. She had worsening of her wheezing and shortness of breath throughout the night. No fever or chills. Patient with increased work of breathing. Speaking in 2-3 word sentences. Diffuse expiratory wheezing. No lower extremity edema.  IV Solu-Medrol and albuterol given en route. Will give 10 mg continuous albuterol treatment. Also started on magnesium.  Loren Raceravid Trampas Stettner, MD 06/21/14 [phone]

## 2014-06-20 NOTE — Progress Notes (Signed)
Pt doing very well on 2 lpm nasal cannula.  Hr of 83 and RR of 18.  Pt sleeping when arrived in room for treatment but aroused easily and mentating well.  BiPAP not indicated at this time.

## 2014-06-21 DIAGNOSIS — R0789 Other chest pain: Secondary | ICD-10-CM

## 2014-06-21 DIAGNOSIS — R609 Edema, unspecified: Secondary | ICD-10-CM

## 2014-06-21 DIAGNOSIS — I517 Cardiomegaly: Secondary | ICD-10-CM

## 2014-06-21 LAB — GLUCOSE, CAPILLARY
GLUCOSE-CAPILLARY: 222 mg/dL — AB (ref 70–99)
GLUCOSE-CAPILLARY: 144 mg/dL — AB (ref 70–99)
GLUCOSE-CAPILLARY: 164 mg/dL — AB (ref 70–99)
Glucose-Capillary: 174 mg/dL — ABNORMAL HIGH (ref 70–99)

## 2014-06-21 LAB — CBC
HCT: 40.2 % (ref 36.0–46.0)
HEMOGLOBIN: 12.8 g/dL (ref 12.0–15.0)
MCH: 29.7 pg (ref 26.0–34.0)
MCHC: 31.8 g/dL (ref 30.0–36.0)
MCV: 93.3 fL (ref 78.0–100.0)
Platelets: 248 10*3/uL (ref 150–400)
RBC: 4.31 MIL/uL (ref 3.87–5.11)
RDW: 13.3 % (ref 11.5–15.5)
WBC: 16.1 10*3/uL — ABNORMAL HIGH (ref 4.0–10.5)

## 2014-06-21 LAB — BASIC METABOLIC PANEL
ANION GAP: 14 (ref 5–15)
BUN: 20 mg/dL (ref 6–23)
CHLORIDE: 101 meq/L (ref 96–112)
CO2: 24 mEq/L (ref 19–32)
Calcium: 9.7 mg/dL (ref 8.4–10.5)
Creatinine, Ser: 0.92 mg/dL (ref 0.50–1.10)
GFR calc Af Amer: 75 mL/min — ABNORMAL LOW (ref >90)
GFR calc non Af Amer: 64 mL/min — ABNORMAL LOW (ref >90)
Glucose, Bld: 160 mg/dL — ABNORMAL HIGH (ref 70–99)
POTASSIUM: 3.7 meq/L (ref 3.7–5.3)
SODIUM: 139 meq/L (ref 137–147)

## 2014-06-21 LAB — MAGNESIUM: MAGNESIUM: 2.3 mg/dL (ref 1.5–2.5)

## 2014-06-21 LAB — TROPONIN I

## 2014-06-21 MED ORDER — IPRATROPIUM-ALBUTEROL 0.5-2.5 (3) MG/3ML IN SOLN
3.0000 mL | Freq: Two times a day (BID) | RESPIRATORY_TRACT | Status: DC
  Administered 2014-06-21 – 2014-06-22 (×2): 3 mL via RESPIRATORY_TRACT
  Filled 2014-06-21 (×2): qty 3

## 2014-06-21 MED ORDER — INSULIN ASPART 100 UNIT/ML ~~LOC~~ SOLN: 0.0000 [IU] | Freq: Every day | SUBCUTANEOUS | Status: DC

## 2014-06-21 MED ORDER — INSULIN ASPART 100 UNIT/ML ~~LOC~~ SOLN
0.0000 [IU] | Freq: Three times a day (TID) | SUBCUTANEOUS | Status: DC
  Administered 2014-06-21: 5 [IU] via SUBCUTANEOUS
  Administered 2014-06-21: 2 [IU] via SUBCUTANEOUS
  Administered 2014-06-22: 3 [IU] via SUBCUTANEOUS

## 2014-06-21 MED ORDER — METHYLPREDNISOLONE SODIUM SUCC 125 MG IJ SOLR
60.0000 mg | Freq: Two times a day (BID) | INTRAMUSCULAR | Status: DC
  Administered 2014-06-21 – 2014-06-22 (×2): 60 mg via INTRAVENOUS
  Filled 2014-06-21 (×3): qty 0.96

## 2014-06-21 NOTE — Progress Notes (Signed)
Utilization review completed.  

## 2014-06-21 NOTE — Progress Notes (Signed)
Received patient as tx from ICU.  Agree with previous RN's assessment of patient.  Have placed patient on telemetry and confirmed with CMT. Will continue to monitor.

## 2014-06-21 NOTE — Progress Notes (Signed)
Pt stable, no increased WOB, RR of 18 and HR of 72.  BiPAP not indicated at this time.

## 2014-06-21 NOTE — Progress Notes (Signed)
PROGRESS NOTE  Ariel Martinez WUJ:[id number] DOB: 1950-08-01 DOA: 06/20/2014 PCP: Evlyn CourierHILL,GERALD K, MD  Brief history 63 y/o female with hx of HTN, HLD presents with one-week history of shortness of breath that has progressively worsened in the past 24 hours. The patient had a colonoscopy performed on 06/14/2014. She began developing shortness of breath on 06/15/2014. She went to see her primary care provider who gave her some anti-tussive meds. The patient's cough improved, but she became progressively shortness of breath as the week went along. The patient was called and an albuterol inhaler on the day prior to admission. She used it, and it did not help.  The patient was admitted for acute respiratory failure and initially placed on BiPAP. In the beginning, she was not able to tolerate it. However when her anxiety was better controlled, she was able to tolerate it. The patient was started on intravenous Solu-Medrol and levofloxacin. Assessment/Plan: Acute respiratory failure -Suspect underlying undiagnosed obstructive lung disease -IV Solu-Medrol--start weaning -Aerosolized albuterol and Atrovent every 4 hours -Intravenous levofloxacin -Pulmonary hygiene -Pulmonary consultation if no improvement -Intravenous morphine for anxiety and chest discomfort when necessary -CT angiogram of the chest was negative for pulmonary embolus -Weaned off biPAP--now stable on RA - continue bronchodilators, Pulmicort - 06/21/2014 echocardiogram-- EF 60-65% , poor quality Abnormal chest x-ray -Suspect the patient may be developing CAP given increased right lower lobe opacity and leukocytosis -Levofloxacin started -Urine legionella antigen--pending -urine Streptococcus pneumoniae antigen--neg Chest pain with abnormal EKG -Cycle troponins--neg x 3 -Continue metoprolol succinate -continue aspirin 81mg  daily Hypertension -Hold amlodipine and ARB due to soft BP -Continue metoprolol succinate Lower  extremity edema -Venous duplex rule out DVT -Continue home dose torsemide -ProBNP 13.6 Hypokalemia -Repleted -Patient was given 2 g magnesium IV in the emergency department -Check magnesium--2.3 Hyperglycemia  -NovoLog sliding scale  -Hemoglobin A1c6.5   Family Communication:   Pt at beside Disposition Plan:   Home 06/22/14 if stable, transfer to floor today       Procedures/Studies: Ct Angio Chest Pe W/cm &/or Wo Cm  06/20/2014   CLINICAL DATA:  Evaluate for pulmonary embolus. Shortness of breath. On Monday pt states she had a colonoscopy and when she woke up she was very hoarse, throat was dry, and it was hard to talk. Pt states she saw her doctor on Tuesday who prescribed her a preventil inhaler for the hoarseness and productive cough (yellow sputum). Respiratory distress has gotten worse and inhaler has not helped per pt. EMS reports upon their arrival she was outside of her home in moderate distress, O2 99% on room air , with wheezing in all fields. Pt given 10 mg of albuterol, 1 mg Atrovent, and 125 mg solu-medrol. Pt arrived with audible expiratory and inspiratory wheezing.  EXAM: CT ANGIOGRAPHY CHEST WITH CONTRAST  TECHNIQUE: Multidetector CT imaging of the chest was performed using the standard protocol during bolus administration of intravenous contrast. Multiplanar CT image reconstructions and MIPs were obtained to evaluate the vascular anatomy.  CONTRAST:  100mL OMNIPAQUE IOHEXOL 350 MG/ML SOLN  COMPARISON:  CT of the abdomen and pelvis 05/25/2014  FINDINGS: Heart: Size mildly prominent. No significant coronary artery calcifications identified on this contrast-enhanced exam.  Vascular structures: Pulmonary arteries are well opacified. No evidence for acute pulmonary embolus. Aorta is partially calcified without aneurysm or dissection. Aorta is tortuous.  Mediastinum/thyroid: No mediastinal, hilar, or axillary adenopathy.  Lungs/Airways: The airways are patent. Minimal bibasilar  atelectasis. No  focal consolidations, pleural effusions, or infiltrates.  Upper abdomen: Unremarkable.  Chest wall/osseous structures: Mild degenerative changes are seen in the thoracic spine. No suspicious lytic or blastic lesions are identified.  Review of the MIP images confirms the above findings.  IMPRESSION: 1. Technically adequate exam showing no pulmonary embolus. 2. Mild cardiac enlargement.  No pulmonary edema.   Electronically Signed   By: Rosalie GumsBeth  Brown M.D.   On: 06/20/2014 12:46   Dg Chest Port 1 View  06/20/2014   CLINICAL DATA:  63 year old female with shortness of breath  EXAM: PORTABLE CHEST - 1 VIEW  COMPARISON:  Prior chest x-ray 03/11/2014  FINDINGS: Cardiac and mediastinal contours are within normal limits. Very low inspiratory volumes with increasing patchy opacity in the bilateral bases. Similar but less conspicuous opacity was present in the right lung base on the prior chest x-ray. There is mild pulmonary vascular congestion bordering on edema. No pneumothorax. No definite pleural effusion. No acute osseous abnormality.  IMPRESSION: 1. Low inspiratory volumes with patchy bibasilar opacities which may reflect atelectasis superimposed on underlying scarring or infiltrates. Given the presence of a mild bibasilar scarring on prior imaging, atelectasis superimposed on chronic changes is favored. 2. Pulmonary vascular congestion bordering on mild edema.   Electronically Signed   By: Malachy MoanHeath  McCullough M.D.   On: 06/20/2014 07:42   Ct Renal Stone Study  05/25/2014   CLINICAL DATA:  Nephrolithiasis.  Left flank pain.  EXAM: CT ABDOMEN AND PELVIS WITHOUT CONTRAST  TECHNIQUE: Multidetector CT imaging of the abdomen and pelvis was performed following the standard protocol without IV contrast.  COMPARISON:  None.  FINDINGS: Lower chest: No pleural or pericardial effusion identified. Scarring identified in the right middle lobe and lingula.  Hepatobiliary: There is no liver abnormality identified.  The gallbladder appears normal. No biliary dilatation.  Pancreas: The pancreas is unremarkable.  Spleen: The spleen appears normal.  Adrenals/Urinary Tract: The adrenal glands are both normal. Small stone within the inferior pole of the right kidney measures 4 mm, image 40/series 2. There are 2 stones within the inferior pole of the left kidney. The largest measures 1 cm. The smaller stone measures 3 mm. No obstructive uropathy identified. No ureteral calculi. The urinary bladder appears normal.  Stomach/Bowel: The stomach is within normal limits. The small bowel loops have a normal course and caliber. No obstruction. Normal appearance of the colon.  Vascular/Lymphatic: Normal appearance of the abdominal aorta. No enlarged retroperitoneal or mesenteric adenopathy. No enlarged pelvic or inguinal lymph nodes.  Reproductive: Previous hysterectomy.  No adnexal mass.  Other: There is no ascites or focal fluid collections within the abdomen or pelvis.  Musculoskeletal: There is degenerative disc disease noted at L3-4 and L4-5. No aggressive lytic or sclerotic bone lesions.  IMPRESSION: 1. No acute findings within the abdomen or pelvis. 2. Bilateral renal calculi.  No obstructive uropathy. 3. Lumbar degenerative disc disease.   Electronically Signed   By: Signa Kellaylor  Stroud M.D.   On: 05/25/2014 14:52         Subjective:  patient denies any fevers, chills, nausea, vomiting, diarrhea, abdominal pain, dysuria. She still has some intermittent chest discomfort, her breathing is better but she is still having some dyspnea on exertion  Objective: Filed Vitals:   06/21/14 0400 06/21/14 0800 06/21/14 0836 06/21/14 1200  BP: 111/68     Pulse: 82     Temp: 97.8 F (36.6 C) 97.8 F (36.6 C)  98.7 F (37.1 C)  TempSrc: Oral Oral  Oral  Resp: 19     Height:      Weight: 86.4 kg (190 lb 7.6 oz)     SpO2: 98%  95%     Intake/Output Summary (Last 24 hours) at 06/21/14 1524 Last data filed at 06/21/14 0800  Gross  per 24 hour  Intake    120 ml  Output    850 ml  Net   -730 ml   Weight change:  Exam:   General:  Pt is alert, follows commands appropriately, not in acute distress  HEENT: No icterus, No thrush,  [place]/AT  Cardiovascular: RRR, S1/S2, no rubs, no gallops  Respiratory: bibasilar rales. Good air movement. No wheezing.  Abdomen: Soft/+BS, non tender, non distended, no guarding  Extremities: 1+LE edema, No lymphangitis, No petechiae, No rashes, no synovitis  Data Reviewed: Basic Metabolic Panel:  Recent Labs Lab 06/20/14 0645 06/21/14 0346  NA 139 139  K 3.4* 3.7  CL 97 101  CO2 25 24  GLUCOSE 194* 160*  BUN 13 20  CREATININE 0.66 0.92  CALCIUM 9.5 9.7  MG  --  2.3   Liver Function Tests:  Recent Labs Lab 06/20/14 0645  AST 23  ALT 17  ALKPHOS 111  BILITOT 0.2*  PROT 8.6*  ALBUMIN 4.1   No results for input(s): LIPASE, AMYLASE in the last 168 hours. No results for input(s): AMMONIA in the last 168 hours. CBC:  Recent Labs Lab 06/20/14 0645 06/21/14 0346  WBC 11.5* 16.1*  NEUTROABS 6.4  --   HGB 14.5 12.8  HCT 44.0 40.2  MCV 93.6 93.3  PLT 265 248   Cardiac Enzymes:  Recent Labs Lab 06/20/14 0645 06/20/14 1141 06/21/14 1106  TROPONINI <0.30 <0.30 <0.30   BNP: Invalid input(s): POCBNP CBG:  Recent Labs Lab 06/20/14 1330 06/20/14 1655 06/20/14 2137 06/21/14 0804 06/21/14 1228  GLUCAP 344* 214* 196* 174* 222*    Recent Results (from the past 240 hour(s))  MRSA PCR Screening     Status: None   Collection Time: 06/20/14  4:47 PM  Result Value Ref Range Status   MRSA by PCR NEGATIVE NEGATIVE Final    Comment:        The GeneXpert MRSA Assay (FDA approved for NASAL specimens only), is one component of a comprehensive MRSA colonization surveillance program. It is not intended to diagnose MRSA infection nor to guide or monitor treatment for MRSA infections.      Scheduled Meds: . antiseptic oral rinse  7 mL Mouth Rinse BID  .  aspirin  325 mg Oral Daily  . budesonide (PULMICORT) nebulizer solution  0.25 mg Nebulization BID  . enoxaparin (LOVENOX) injection  40 mg Subcutaneous Q24H  . insulin aspart  0-15 Units Subcutaneous TID WC  . insulin aspart  0-5 Units Subcutaneous QHS  . ipratropium-albuterol  3 mL Nebulization Q4H  . levofloxacin (LEVAQUIN) IV  750 mg Intravenous Q24H  . methylPREDNISolone (SOLU-MEDROL) injection  80 mg Intravenous Q6H  . metoprolol succinate  25 mg Oral Daily  . potassium chloride  10 mEq Oral Daily  . simvastatin  20 mg Oral q1800  . sodium chloride  3 mL Intravenous Q12H   Continuous Infusions:    Pricilla Moehle, DO  [facility] Pager [phone]  If 7PM-7AM, please contact night-coverage [email or web address] Password [email or web address] 06/21/2014, 3:24 PM   LOS: 1 day

## 2014-06-21 NOTE — Progress Notes (Signed)
*  PRELIMINARY RESULTS* Vascular Ultrasound Lower extremity venous duplex has been completed.  Preliminary findings: no evidence of DVT  Farrel DemarkJill Eunice, RDMS, RVT  06/21/2014, 9:44 AM

## 2014-06-21 NOTE — Progress Notes (Signed)
  Echocardiogram 2D Echocardiogram has been performed.  Ariel Martinez FRANCES 06/21/2014, 1:19 PM

## 2014-06-21 NOTE — Discharge Summary (Signed)
Physician Discharge Summary  Ariel Martinez DGU:[id number] DOB: 1951-05-28 DOA: 06/20/2014  PCP: Evlyn CourierHILL,GERALD K, MD  Admit date: 06/20/2014 Discharge date: 06/22/14 Recommendations for Outpatient Follow-up:  1. Pt will need to follow up with PCP in 2 weeks post discharge 2. Please obtain BMP and CBC in one week  Discharge Diagnoses:  Acute respiratory failure -Suspect underlying undiagnosed obstructive lung disease/COPD exacerbation -IV Solu-Medrol-- the patient head good clinical response - IV Solu-Medrol was weaned - the patient will be sent home with prednisone Taper-- 60 mg daily 2 days, 40 mg daily 2 days, 20 mg daily 2 days -Aerosolized albuterol and Atrovent every 4 hours -Intravenous levofloxacin during hospitalization -Pulmonary hygiene - patient will be sent home with long-acting beta agonist inhaler - She will ultimately need a full set of pulmonology function tests when she is clinically stable -Intravenous morphine for anxiety for chest discomfort when necessary -CT angiogram of the chest was negative for pulmonary embolus -Weaned off biPAP--now stable on RA - continue bronchodilators, Pulmicort - 06/21/2014 echocardiogram-- EF 60-65% , poor quality -levofloxacin 750mg  po daily x 3 more days to finish 5 days of therapy Abnormal chest x-ray -Suspect the patient may be developing CAP given increased right lower lobe opacity and leukocytosis -Levofloxacin started -Urine legionella antigen--pending -urine Streptococcus pneumoniae antigen--neg Chest pain with abnormal EKG -Cycle troponins--neg x 3 -Continue metoprolol succinate -continue aspirin 81mg  daily Hypertension -Hold amlodipine and ARB due to soft BP--will not restart -Continue metoprolol succinate Lower extremity edema -Venous duplex rule out DVT-- negative -Continue home dose torsemide -ProBNP 13.6 Hypokalemia -Repleted -Patient was given 2 g magnesium IV in the emergency department -Check  magnesium--2.3 Hyperglycemia/Diabetes Mellitus -NovoLog sliding scale  -Hemoglobin A1c-6.5  - discussed weight loss and lifestyle modification - will not start the patient on any diabetic agents at this time. She will follow-up with her primary care provider for further discussion Anxiety -ativan 0.5mg  q8hrs prn anxiety, #15, No refills -follow up with PCP Discharge Condition: stable  Disposition: home  Diet:carbohydrate modified Wt Readings from Last 3 Encounters:  06/21/14 86.4 kg (190 lb 7.6 oz)    History of present illness:  63 y/o female with hx of HTN, HLD presents with one-week history of shortness of breath that has progressively worsened in the past 24 hours. The patient had a colonoscopy performed on 06/14/2014. She began developing shortness of breath on 06/15/2014. She went to see her primary care provider who gave her some anti-tussive meds. The patient's cough improved, but she became progressively shortness of breath as the week went along. The patient was called and an albuterol inhaler on the day prior to admission. She used it, and it did not help. The patient was admitted for acute respiratory failure and initially placed on BiPAP. In the beginning, she was not able to tolerate it. However when her anxiety was better controlled, she was able to tolerate it. The patient was started on intravenous Solu-Medrol and levofloxacin.   Because of the patient's continued chest discomfort an initial tachycardia with respiratory failure, CT angiogram of the chest was performed and was negative for pulmonary embolus. The patient experienced good clinical improvement with intravenous steroids and bronchodilators. She was transitioned to the medical floor where she remained clinically stable and was weaned off of supplemental oxygen. She will ultimately Need full pulmonary function tests when she is clinically stable.   Discharge Exam: Filed Vitals:   06/22/14 0420  BP: 127/97    Pulse: 79  Temp: 97.8 F (36.6  C)  Resp: 20   Filed Vitals:   06/21/14 2055 06/21/14 2140 06/22/14 0420 06/22/14 0935  BP:  123/63 127/97   Pulse:  92 79   Temp:  98.4 F (36.9 C) 97.8 F (36.6 C)   TempSrc:  Oral Oral   Resp:  24 20   Height:      Weight:      SpO2: 99% 96% 98% 94%   General: A&O x 3, NAD, pleasant, cooperative Cardiovascular: RRR, no rub, no gallop, no S3 Respiratory:  Bibasilar rales. No wheezing. Good air movement Abdomen:soft, nontender, nondistended, positive bowel sounds Extremities: No edema, No lymphangitis, no petechiae  Discharge Instructions      Discharge Instructions    Diet - low sodium heart healthy    Complete by:  As directed      Increase activity slowly    Complete by:  As directed             Medication List    STOP taking these medications        AZOR 10-40 MG per tablet  Generic drug:  amLODipine-olmesartan     ibuprofen 200 MG tablet  Commonly known as:  ADVIL,MOTRIN      TAKE these medications        albuterol 108 (90 BASE) MCG/ACT inhaler  Commonly known as:  PROVENTIL HFA;VENTOLIN HFA  Inhale 2 puffs into the lungs every 4 (four) hours as needed for wheezing or shortness of breath.     KLOR-CON M10 10 MEQ tablet  Generic drug:  potassium chloride  Take 10 mEq by mouth daily.     levofloxacin 750 MG tablet  Commonly known as:  LEVAQUIN  Take 1 tablet (750 mg total) by mouth daily.  Start taking on:  06/23/2014     LORazepam 0.5 MG tablet  Commonly known as:  ATIVAN  Take 1 tablet (0.5 mg total) by mouth every 8 (eight) hours as needed for anxiety.     metoprolol succinate 25 MG 24 hr tablet  Commonly known as:  TOPROL-XL  Take 25 mg by mouth daily.     predniSONE 20 MG tablet  Commonly known as:  DELTASONE  Take 3 tablets (60 mg total) by mouth daily with breakfast. X 2 days, then 2 tabs (40mg ) x 2 days, then 1 tab (20mg ) x 2 days  Start taking on:  06/23/2014     simvastatin 20 MG tablet   Commonly known as:  ZOCOR  Take 20 mg by mouth daily.     torsemide 20 MG tablet  Commonly known as:  DEMADEX  Take 20 mg by mouth daily.         The results of significant diagnostics from this hospitalization (including imaging, microbiology, ancillary and laboratory) are listed below for reference.    Significant Diagnostic Studies: Ct Angio Chest Pe W/cm &/or Wo Cm  06/20/2014   CLINICAL DATA:  Evaluate for pulmonary embolus. Shortness of breath. On Monday pt states she had a colonoscopy and when she woke up she was very hoarse, throat was dry, and it was hard to talk. Pt states she saw her doctor on Tuesday who prescribed her a preventil inhaler for the hoarseness and productive cough (yellow sputum). Respiratory distress has gotten worse and inhaler has not helped per pt. EMS reports upon their arrival she was outside of her home in moderate distress, O2 99% on room air , with wheezing in all fields. Pt given 10 mg of albuterol, 1  mg Atrovent, and 125 mg solu-medrol. Pt arrived with audible expiratory and inspiratory wheezing.  EXAM: CT ANGIOGRAPHY CHEST WITH CONTRAST  TECHNIQUE: Multidetector CT imaging of the chest was performed using the standard protocol during bolus administration of intravenous contrast. Multiplanar CT image reconstructions and MIPs were obtained to evaluate the vascular anatomy.  CONTRAST:  100mL OMNIPAQUE IOHEXOL 350 MG/ML SOLN  COMPARISON:  CT of the abdomen and pelvis 05/25/2014  FINDINGS: Heart: Size mildly prominent. No significant coronary artery calcifications identified on this contrast-enhanced exam.  Vascular structures: Pulmonary arteries are well opacified. No evidence for acute pulmonary embolus. Aorta is partially calcified without aneurysm or dissection. Aorta is tortuous.  Mediastinum/thyroid: No mediastinal, hilar, or axillary adenopathy.  Lungs/Airways: The airways are patent. Minimal bibasilar atelectasis. No focal consolidations, pleural effusions,  or infiltrates.  Upper abdomen: Unremarkable.  Chest wall/osseous structures: Mild degenerative changes are seen in the thoracic spine. No suspicious lytic or blastic lesions are identified.  Review of the MIP images confirms the above findings.  IMPRESSION: 1. Technically adequate exam showing no pulmonary embolus. 2. Mild cardiac enlargement.  No pulmonary edema.   Electronically Signed   By: Rosalie GumsBeth  Brown M.D.   On: 06/20/2014 12:46   Dg Chest Port 1 View  06/20/2014   CLINICAL DATA:  63 year old female with shortness of breath  EXAM: PORTABLE CHEST - 1 VIEW  COMPARISON:  Prior chest x-ray 03/11/2014  FINDINGS: Cardiac and mediastinal contours are within normal limits. Very low inspiratory volumes with increasing patchy opacity in the bilateral bases. Similar but less conspicuous opacity was present in the right lung base on the prior chest x-ray. There is mild pulmonary vascular congestion bordering on edema. No pneumothorax. No definite pleural effusion. No acute osseous abnormality.  IMPRESSION: 1. Low inspiratory volumes with patchy bibasilar opacities which may reflect atelectasis superimposed on underlying scarring or infiltrates. Given the presence of a mild bibasilar scarring on prior imaging, atelectasis superimposed on chronic changes is favored. 2. Pulmonary vascular congestion bordering on mild edema.   Electronically Signed   By: Malachy MoanHeath  McCullough M.D.   On: 06/20/2014 07:42   Ct Renal Stone Study  05/25/2014   CLINICAL DATA:  Nephrolithiasis.  Left flank pain.  EXAM: CT ABDOMEN AND PELVIS WITHOUT CONTRAST  TECHNIQUE: Multidetector CT imaging of the abdomen and pelvis was performed following the standard protocol without IV contrast.  COMPARISON:  None.  FINDINGS: Lower chest: No pleural or pericardial effusion identified. Scarring identified in the right middle lobe and lingula.  Hepatobiliary: There is no liver abnormality identified. The gallbladder appears normal. No biliary dilatation.   Pancreas: The pancreas is unremarkable.  Spleen: The spleen appears normal.  Adrenals/Urinary Tract: The adrenal glands are both normal. Small stone within the inferior pole of the right kidney measures 4 mm, image 40/series 2. There are 2 stones within the inferior pole of the left kidney. The largest measures 1 cm. The smaller stone measures 3 mm. No obstructive uropathy identified. No ureteral calculi. The urinary bladder appears normal.  Stomach/Bowel: The stomach is within normal limits. The small bowel loops have a normal course and caliber. No obstruction. Normal appearance of the colon.  Vascular/Lymphatic: Normal appearance of the abdominal aorta. No enlarged retroperitoneal or mesenteric adenopathy. No enlarged pelvic or inguinal lymph nodes.  Reproductive: Previous hysterectomy.  No adnexal mass.  Other: There is no ascites or focal fluid collections within the abdomen or pelvis.  Musculoskeletal: There is degenerative disc disease noted at L3-4 and L4-5. No  aggressive lytic or sclerotic bone lesions.  IMPRESSION: 1. No acute findings within the abdomen or pelvis. 2. Bilateral renal calculi.  No obstructive uropathy. 3. Lumbar degenerative disc disease.   Electronically Signed   By: Signa Kell M.D.   On: 05/25/2014 14:52     Microbiology: Recent Results (from the past 240 hour(s))  MRSA PCR Screening     Status: None   Collection Time: 06/20/14  4:47 PM  Result Value Ref Range Status   MRSA by PCR NEGATIVE NEGATIVE Final    Comment:        The GeneXpert MRSA Assay (FDA approved for NASAL specimens only), is one component of a comprehensive MRSA colonization surveillance program. It is not intended to diagnose MRSA infection nor to guide or monitor treatment for MRSA infections.      Labs: Basic Metabolic Panel:  Recent Labs Lab 06/20/14 0645 06/21/14 0346 06/22/14 0422  NA 139 139 139  K 3.4* 3.7 3.7  CL 97 101 100  CO2 25 24 24   GLUCOSE 194* 160* 193*  BUN 13 20  37*  CREATININE 0.66 0.92 1.09  CALCIUM 9.5 9.7 9.5  MG  --  2.3  --    Liver Function Tests:  Recent Labs Lab 06/20/14 0645  AST 23  ALT 17  ALKPHOS 111  BILITOT 0.2*  PROT 8.6*  ALBUMIN 4.1   No results for input(s): LIPASE, AMYLASE in the last 168 hours. No results for input(s): AMMONIA in the last 168 hours. CBC:  Recent Labs Lab 06/20/14 0645 06/21/14 0346  WBC 11.5* 16.1*  NEUTROABS 6.4  --   HGB 14.5 12.8  HCT 44.0 40.2  MCV 93.6 93.3  PLT 265 248   Cardiac Enzymes:  Recent Labs Lab 06/20/14 0645 06/20/14 1141 06/21/14 1106  TROPONINI <0.30 <0.30 <0.30   BNP: Invalid input(s): POCBNP CBG:  Recent Labs Lab 06/21/14 1228 06/21/14 1704 06/21/14 2149 06/22/14 0731 06/22/14 1152  GLUCAP 222* 144* 164* 157* 113*    Time coordinating discharge:  Greater than 30 minutes  Signed:  Keanthony Poole, DO [facility] Pager: [phone] 06/22/2014, 1:55 PM

## 2014-06-22 DIAGNOSIS — J9601 Acute respiratory failure with hypoxia: Secondary | ICD-10-CM | POA: Insufficient documentation

## 2014-06-22 LAB — BASIC METABOLIC PANEL WITH GFR
Anion gap: 15 (ref 5–15)
BUN: 37 mg/dL — ABNORMAL HIGH (ref 6–23)
CO2: 24 meq/L (ref 19–32)
Calcium: 9.5 mg/dL (ref 8.4–10.5)
Chloride: 100 meq/L (ref 96–112)
Creatinine, Ser: 1.09 mg/dL (ref 0.50–1.10)
GFR calc Af Amer: 61 mL/min — ABNORMAL LOW
GFR calc non Af Amer: 52 mL/min — ABNORMAL LOW
Glucose, Bld: 193 mg/dL — ABNORMAL HIGH (ref 70–99)
Potassium: 3.7 meq/L (ref 3.7–5.3)
Sodium: 139 meq/L (ref 137–147)

## 2014-06-22 LAB — LEGIONELLA ANTIGEN, URINE

## 2014-06-22 LAB — GLUCOSE, CAPILLARY
Glucose-Capillary: 157 mg/dL — ABNORMAL HIGH (ref 70–99)
Glucose-Capillary: 113 mg/dL — ABNORMAL HIGH (ref 70–99)

## 2014-06-22 LAB — PROCALCITONIN: Procalcitonin: <0.1 ng/mL

## 2014-06-22 MED ORDER — LORAZEPAM 0.5 MG PO TABS
0.5000 mg | ORAL_TABLET | Freq: Three times a day (TID) | ORAL | Status: DC | PRN
Start: 2014-06-22 — End: 2015-05-21

## 2014-06-22 MED ORDER — PREDNISONE 50 MG PO TABS
60.0000 mg | ORAL_TABLET | Freq: Every day | ORAL | Status: DC
  Filled 2014-06-22: qty 1

## 2014-06-22 MED ORDER — LEVOFLOXACIN 750 MG PO TABS: 750.0000 mg | ORAL_TABLET | Freq: Every day | ORAL | Status: DC

## 2014-06-22 MED ORDER — PREDNISONE 20 MG PO TABS: 60.0000 mg | ORAL_TABLET | Freq: Every day | ORAL | Status: DC

## 2014-06-22 MED ORDER — LEVOFLOXACIN 750 MG PO TABS
750.0000 mg | ORAL_TABLET | Freq: Every day | ORAL | Status: DC
  Filled 2014-06-22: qty 1

## 2014-09-15 DIAGNOSIS — I10 Essential (primary) hypertension: Secondary | ICD-10-CM | POA: Diagnosis not present

## 2014-12-15 DIAGNOSIS — I10 Essential (primary) hypertension: Secondary | ICD-10-CM | POA: Diagnosis not present

## 2015-03-16 DIAGNOSIS — I10 Essential (primary) hypertension: Secondary | ICD-10-CM | POA: Diagnosis not present

## 2015-05-10 ENCOUNTER — Ambulatory Visit: Payer: Self-pay | Admitting: Urology

## 2015-05-21 ENCOUNTER — Encounter (HOSPITAL_COMMUNITY): Payer: Self-pay | Admitting: Emergency Medicine

## 2015-05-21 ENCOUNTER — Emergency Department (HOSPITAL_COMMUNITY): Payer: Commercial Managed Care - HMO

## 2015-05-21 ENCOUNTER — Observation Stay (HOSPITAL_COMMUNITY)
Admission: EM | Admit: 2015-05-21 | Discharge: 2015-05-22 | Disposition: A | Discharge status: Home / Self Care | Payer: Commercial Managed Care - HMO | Attending: Family Medicine | Admitting: Family Medicine

## 2015-05-21 DIAGNOSIS — J441 Chronic obstructive pulmonary disease with (acute) exacerbation: Secondary | ICD-10-CM | POA: Diagnosis not present

## 2015-05-21 DIAGNOSIS — Z9849 Cataract extraction status, unspecified eye: Secondary | ICD-10-CM | POA: Insufficient documentation

## 2015-05-21 DIAGNOSIS — Z79899 Other long term (current) drug therapy: Secondary | ICD-10-CM | POA: Diagnosis not present

## 2015-05-21 DIAGNOSIS — R0602 Shortness of breath: Secondary | ICD-10-CM | POA: Diagnosis not present

## 2015-05-21 DIAGNOSIS — J45909 Unspecified asthma, uncomplicated: Secondary | ICD-10-CM | POA: Diagnosis not present

## 2015-05-21 DIAGNOSIS — I10 Essential (primary) hypertension: Secondary | ICD-10-CM | POA: Diagnosis not present

## 2015-05-21 DIAGNOSIS — E7800 Pure hypercholesterolemia, unspecified: Secondary | ICD-10-CM | POA: Diagnosis not present

## 2015-05-21 DIAGNOSIS — R05 Cough: Secondary | ICD-10-CM | POA: Diagnosis not present

## 2015-05-21 DIAGNOSIS — E876 Hypokalemia: Secondary | ICD-10-CM | POA: Diagnosis present

## 2015-05-21 HISTORY — DX: Chronic obstructive pulmonary disease, unspecified: J44.9

## 2015-05-21 LAB — BASIC METABOLIC PANEL
ANION GAP: 8 (ref 5–15)
BUN: 12 mg/dL (ref 6–20)
CO2: 26 mmol/L (ref 22–32)
Calcium: 8.8 mg/dL — ABNORMAL LOW (ref 8.9–10.3)
Chloride: 103 mmol/L (ref 101–111)
Creatinine, Ser: 0.8 mg/dL (ref 0.44–1.00)
GFR calc Af Amer: >60 mL/min (ref >60)
GLUCOSE: 197 mg/dL — AB (ref 65–99)
POTASSIUM: 3.3 mmol/L — AB (ref 3.5–5.1)
Sodium: 137 mmol/L (ref 135–145)

## 2015-05-21 LAB — CBC WITH DIFFERENTIAL/PLATELET
Basophils Absolute: 0 10*3/uL (ref 0.0–0.1)
Basophils Relative: 1 %
EOS ABS: 0.9 10*3/uL — AB (ref 0.0–0.7)
Eosinophils Relative: 14 %
HEMATOCRIT: 40.5 % (ref 36.0–46.0)
HEMOGLOBIN: 13.4 g/dL (ref 12.0–15.0)
LYMPHS ABS: 2 10*3/uL (ref 0.7–4.0)
Lymphocytes Relative: 32 %
MCH: 30.4 pg (ref 26.0–34.0)
MCHC: 33.1 g/dL (ref 30.0–36.0)
MCV: 91.8 fL (ref 78.0–100.0)
Monocytes Absolute: 0.6 10*3/uL (ref 0.1–1.0)
Monocytes Relative: 9 %
NEUTROS PCT: 46 %
Neutro Abs: 2.9 10*3/uL (ref 1.7–7.7)
Platelets: 210 10*3/uL (ref 150–400)
RBC: 4.41 MIL/uL (ref 3.87–5.11)
RDW: 12.9 % (ref 11.5–15.5)
WBC: 6.4 10*3/uL (ref 4.0–10.5)

## 2015-05-21 LAB — BRAIN NATRIURETIC PEPTIDE: B Natriuretic Peptide: 9 pg/mL (ref 0.0–100.0)

## 2015-05-21 LAB — TROPONIN I: Troponin I: <0.03 ng/mL (ref <0.031)

## 2015-05-21 MED ORDER — ALBUTEROL (5 MG/ML) CONTINUOUS INHALATION SOLN
10.0000 mg/h | INHALATION_SOLUTION | Freq: Once | RESPIRATORY_TRACT | Status: AC
  Administered 2015-05-21: 10 mg/h via RESPIRATORY_TRACT

## 2015-05-21 MED ORDER — TORSEMIDE 20 MG PO TABS
20.0000 mg | ORAL_TABLET | Freq: Every day | ORAL | Status: DC | PRN
  Filled 2015-05-21: qty 1

## 2015-05-21 MED ORDER — SIMVASTATIN 20 MG PO TABS
20.0000 mg | ORAL_TABLET | Freq: Every day | ORAL | Status: DC
  Administered 2015-05-21 – 2015-05-22 (×2): 20 mg via ORAL
  Filled 2015-05-21 (×2): qty 1

## 2015-05-21 MED ORDER — ALBUTEROL SULFATE (2.5 MG/3ML) 0.083% IN NEBU: 2.5000 mg | INHALATION_SOLUTION | Freq: Four times a day (QID) | RESPIRATORY_TRACT | Status: DC

## 2015-05-21 MED ORDER — METHYLPREDNISOLONE SODIUM SUCC 125 MG IJ SOLR
125.0000 mg | Freq: Once | INTRAMUSCULAR | Status: AC
  Administered 2015-05-21: 125 mg via INTRAVENOUS
  Filled 2015-05-21: qty 2

## 2015-05-21 MED ORDER — METOPROLOL SUCCINATE ER 25 MG PO TB24
25.0000 mg | ORAL_TABLET | Freq: Every day | ORAL | Status: DC
  Administered 2015-05-21 – 2015-05-22 (×2): 25 mg via ORAL
  Filled 2015-05-21 (×2): qty 1

## 2015-05-21 MED ORDER — POTASSIUM CHLORIDE CRYS ER 20 MEQ PO TBCR
40.0000 meq | EXTENDED_RELEASE_TABLET | Freq: Once | ORAL | Status: AC
  Administered 2015-05-21: 40 meq via ORAL
  Filled 2015-05-21: qty 2

## 2015-05-21 MED ORDER — IPRATROPIUM-ALBUTEROL 0.5-2.5 (3) MG/3ML IN SOLN
3.0000 mL | Freq: Four times a day (QID) | RESPIRATORY_TRACT | Status: DC
  Administered 2015-05-21 (×3): 3 mL via RESPIRATORY_TRACT
  Filled 2015-05-21 (×3): qty 3

## 2015-05-21 MED ORDER — LORATADINE 10 MG PO TABS
10.0000 mg | ORAL_TABLET | Freq: Every day | ORAL | Status: DC
  Administered 2015-05-21 – 2015-05-22 (×2): 10 mg via ORAL
  Filled 2015-05-21 (×2): qty 1

## 2015-05-21 MED ORDER — AMLODIPINE-OLMESARTAN 10-40 MG PO TABS: 1.0000 | ORAL_TABLET | Freq: Every day | ORAL | Status: DC

## 2015-05-21 MED ORDER — AMLODIPINE BESYLATE 10 MG PO TABS
10.0000 mg | ORAL_TABLET | Freq: Every day | ORAL | Status: DC
  Administered 2015-05-21 – 2015-05-22 (×2): 10 mg via ORAL
  Filled 2015-05-21 (×2): qty 1

## 2015-05-21 MED ORDER — IPRATROPIUM BROMIDE 0.02 % IN SOLN: 0.5000 mg | Freq: Once | RESPIRATORY_TRACT | Status: DC

## 2015-05-21 MED ORDER — ALBUTEROL (5 MG/ML) CONTINUOUS INHALATION SOLN
10.0000 mg/h | INHALATION_SOLUTION | Freq: Once | RESPIRATORY_TRACT | Status: DC
  Filled 2015-05-21: qty 20

## 2015-05-21 MED ORDER — IPRATROPIUM BROMIDE 0.02 % IN SOLN
1.0000 mg | Freq: Once | RESPIRATORY_TRACT | Status: AC
  Administered 2015-05-21: 1 mg via RESPIRATORY_TRACT
  Filled 2015-05-21: qty 5

## 2015-05-21 MED ORDER — PREDNISONE 20 MG PO TABS
40.0000 mg | ORAL_TABLET | Freq: Every day | ORAL | Status: DC
  Administered 2015-05-21 – 2015-05-22 (×2): 40 mg via ORAL
  Filled 2015-05-21 (×3): qty 2

## 2015-05-21 MED ORDER — IPRATROPIUM-ALBUTEROL 0.5-2.5 (3) MG/3ML IN SOLN
3.0000 mL | Freq: Once | RESPIRATORY_TRACT | Status: AC
  Administered 2015-05-21: 3 mL via RESPIRATORY_TRACT
  Filled 2015-05-21: qty 3

## 2015-05-21 MED ORDER — ALBUTEROL SULFATE (2.5 MG/3ML) 0.083% IN NEBU: 2.5000 mg | INHALATION_SOLUTION | Freq: Four times a day (QID) | RESPIRATORY_TRACT | Status: DC | PRN

## 2015-05-21 MED ORDER — ALBUTEROL SULFATE (2.5 MG/3ML) 0.083% IN NEBU: 2.5000 mg | INHALATION_SOLUTION | RESPIRATORY_TRACT | Status: DC | PRN

## 2015-05-21 MED ORDER — IPRATROPIUM-ALBUTEROL 0.5-2.5 (3) MG/3ML IN SOLN
3.0000 mL | Freq: Three times a day (TID) | RESPIRATORY_TRACT | Status: DC
  Administered 2015-05-22: 3 mL via RESPIRATORY_TRACT
  Filled 2015-05-21: qty 3

## 2015-05-21 MED ORDER — IRBESARTAN 300 MG PO TABS
300.0000 mg | ORAL_TABLET | Freq: Every day | ORAL | Status: DC
  Administered 2015-05-21 – 2015-05-22 (×2): 300 mg via ORAL
  Filled 2015-05-21 (×2): qty 1

## 2015-05-21 NOTE — ED Notes (Signed)
Admitting MD at bedside.

## 2015-05-21 NOTE — ED Provider Notes (Signed)
CSN: [id number]     Arrival date & time 05/21/15  0138 History   First MD Initiated Contact with Patient 05/21/15 0203     Chief Complaint  Patient presents with  . asthma       HPI  Pt was seen at 0210.  Per pt, c/o gradual onset and worsening of persistent cough, wheezing and SOB for the past 2 weeks.  Describes her symptoms as "they told me last year I had COPD."  Pt states she does not have home MDI or neb machine to treat her symptoms. Denies CP/palpitations, no back pain, no abd pain, no N/V/D, no fevers, no rash.    Past Medical History  Diagnosis Date  . Hypertension   . High cholesterol   . Heart disease   . COPD (chronic obstructive pulmonary disease) [facility])    Past Surgical History  Procedure Laterality Date  . Cataract extraction      Social History  Substance Use Topics  . Smoking status: Never Smoker   . Smokeless tobacco: None  . Alcohol Use: No    Review of Systems ROS: Statement: All systems negative except as marked or noted in the HPI; Constitutional: Negative for fever and chills. ; ; Eyes: Negative for eye pain, redness and discharge. ; ; ENMT: Negative for ear pain, hoarseness, nasal congestion, sinus pressure and sore throat. ; ; Cardiovascular: Negative for chest pain, palpitations, diaphoresis, and peripheral edema. ; ; Respiratory: +SOB, cough, wheezing. Negative for stridor. ; ; Gastrointestinal: Negative for nausea, vomiting, diarrhea, abdominal pain, blood in stool, hematemesis, jaundice and rectal bleeding. . ; ; Genitourinary: Negative for dysuria, flank pain and hematuria. ; ; Musculoskeletal: Negative for back pain and neck pain. Negative for swelling and trauma.; ; Skin: Negative for pruritus, rash, abrasions, blisters, bruising and skin lesion.; ; Neuro: Negative for headache, lightheadedness and neck stiffness. Negative for weakness, altered level of consciousness , altered mental status, extremity weakness, paresthesias, involuntary movement,  seizure and syncope.        Allergies  Review of patient's allergies indicates no known allergies.  Home Medications   Prior to Admission medications   Medication Sig Start Date End Date Taking? Authorizing Provider  AZOR 10-40 MG tablet Take 1 tablet by mouth daily. 04/09/15  Yes Historical Provider, MD  KLOR-CON M10 10 MEQ tablet Take 10 mEq by mouth daily as needed (only when taking with torsemide).  03/24/14  Yes Historical Provider, MD  loratadine (CLARITIN) 10 MG tablet Take 10 mg by mouth daily.   Yes Historical Provider, MD  metoprolol succinate (TOPROL-XL) 25 MG 24 hr tablet Take 25 mg by mouth daily. 04/09/14  Yes Historical Provider, MD  simvastatin (ZOCOR) 20 MG tablet Take 20 mg by mouth daily.   Yes Historical Provider, MD  torsemide (DEMADEX) 20 MG tablet Take 20 mg by mouth daily as needed (edema).  04/09/14  Yes Historical Provider, MD   BP 126/69 mmHg  Pulse 93  Temp(Src) 98.5 F (36.9 C) (Axillary)  Resp 21  SpO2 97% Physical Exam  0215: Physical examination:  Nursing notes reviewed; Vital signs and O2 SAT reviewed;  Constitutional: Well developed, Well nourished, Well hydrated, Uncomfortable appearing; Head:  Normocephalic, atraumatic; Eyes: EOMI, PERRL, No scleral icterus; ENMT: Mouth and pharynx normal, Mucous membranes moist; Neck: Supple, Full range of motion, No lymphadenopathy; Cardiovascular: Regular rate and rhythm, No gallop; Respiratory: Breath sounds diminished & equal bilaterally, insp/exp wheezes bilat, occasional audible wheezing. Speaking phrases, sitting upright, tachypneic.; Chest: Nontender,  Movement normal; Abdomen: Soft, Nontender, Nondistended, Normal bowel sounds; Genitourinary: No CVA tenderness; Extremities: Pulses normal, No tenderness, No edema, No calf edema or asymmetry.; Neuro: AA&Ox3, Major CN grossly intact.  Speech clear. No gross focal motor or sensory deficits in extremities.; Skin: Color normal, Warm, Dry.   ED Course  Procedures  (including critical care time) Labs Review   Imaging Review  I have personally reviewed and evaluated these images and lab results as part of my medical decision-making.   EKG Interpretation None      MDM  MDM Reviewed: previous chart, nursing note and vitals Reviewed previous: labs and ECG Interpretation: labs, ECG and x-ray Total time providing critical care: 30-74 minutes. This excludes time spent performing separately reportable procedures and services. Consults: admitting MD     CRITICAL CARE Performed by: Laray Anger Total critical care time: 35 minutes Critical care time was exclusive of separately billable procedures and treating other patients. Critical care was necessary to treat or prevent imminent or life-threatening deterioration. Critical care was time spent personally by me on the following activities: development of treatment plan with patient and/or surrogate as well as nursing, discussions with consultants, evaluation of patient's response to treatment, examination of patient, obtaining history from patient or surrogate, ordering and performing treatments and interventions, ordering and review of laboratory studies, ordering and review of radiographic studies, pulse oximetry and re-evaluation of patient's condition.   Results for orders placed or performed during the hospital encounter of 05/21/15  Basic metabolic panel  Result Value Ref Range   Sodium 137 135 - 145 mmol/L   Potassium 3.3 (L) 3.5 - 5.1 mmol/L   Chloride 103 101 - 111 mmol/L   CO2 26 22 - 32 mmol/L   Glucose, Bld 197 (H) 65 - 99 mg/dL   BUN 12 6 - 20 mg/dL   Creatinine, Ser 1.61 0.44 - 1.00 mg/dL   Calcium 8.8 (L) 8.9 - 10.3 mg/dL   GFR calc non Af Amer >60 >60 mL/min   GFR calc Af Amer >60 >60 mL/min   Anion gap 8 5 - 15  Brain natriuretic peptide  Result Value Ref Range   B Natriuretic Peptide 9.0 0.0 - 100.0 pg/mL  Troponin I  Result Value Ref Range   Troponin I <0.03  <0.031 ng/mL  CBC with Differential  Result Value Ref Range   WBC 6.4 4.0 - 10.5 K/uL   RBC 4.41 3.87 - 5.11 MIL/uL   Hemoglobin 13.4 12.0 - 15.0 g/dL   HCT 09.6 04.5 - 40.9 %   MCV 91.8 78.0 - 100.0 fL   MCH 30.4 26.0 - 34.0 pg   MCHC 33.1 30.0 - 36.0 g/dL   RDW 81.1 91.4 - 78.2 %   Platelets 210 150 - 400 K/uL   Neutrophils Relative % 46 %   Neutro Abs 2.9 1.7 - 7.7 K/uL   Lymphocytes Relative 32 %   Lymphs Abs 2.0 0.7 - 4.0 K/uL   Monocytes Relative 9 %   Monocytes Absolute 0.6 0.1 - 1.0 K/uL   Eosinophils Relative 14 %   Eosinophils Absolute 0.9 (H) 0.0 - 0.7 K/uL   Basophils Relative 1 %   Basophils Absolute 0.0 0.0 - 0.1 K/uL   Dg Chest Port 1 View 05/21/2015  CLINICAL DATA:  Subacute onset of shortness of breath and cough. Initial encounter. EXAM: PORTABLE CHEST 1 VIEW COMPARISON:  Chest radiograph and CTA of the chest performed 06/20/2014 FINDINGS: The lungs are well-aerated. Mild vascular congestion is  noted. Mild bibasilar opacities appear to reflect previously noted scarring. There is no evidence of pleural effusion or pneumothorax. The cardiomediastinal silhouette is borderline enlarged. No acute osseous abnormalities are seen. IMPRESSION: Mild vascular congestion and borderline cardiomegaly. Mild bibasilar airspace opacities appear to reflect previously noted scarring, without definite pulmonary edema. Electronically Signed   By: Roanna RaiderJeffery  Chang M.D.   On: 05/21/2015 02:11    0500:  On arrival: pt sitting upright, tachypneic, insp/exp wheezing bilat, Sats 86% R/A. IV solumedrol and hour long neb started. After neb and steroid: pt ambulated with O2 Sats dropping from 97% R/A at rest to 94% R/A, with pt c/o increasing SOB, increasing RR, and lungs again with insp/exp wheezing bilat. Will admit. Dx and testing d/w pt and family.  Questions answered.  Verb understanding, agreeable to admit. T/C to [facility]. Maryfrances Bunnellanford, case discussed, including:  HPI, pertinent PM/SHx, VS/PE, dx  testing, ED course and treatment:  Agreeable to admit, requests to write temporary orders, obtain medical bed to team [facility].   Samuel JesterKathleen Lynk Marti, DO 05/23/15 [phone]

## 2015-05-21 NOTE — Progress Notes (Signed)
9:32 AM I agree with HPI/GPe and A/P per Dr. Maryfrances Bunnellanford  See HPI Patient seen and interviewed Doing fair  Feels a little better Wheeze slighlty improved.  NO cp      Patient Active Problem List   Diagnosis Date Noted  . COPD with acute exacerbation (HCC) 05/21/2015  . Acute respiratory failure with hypoxia (HCC)   . Acute respiratory failure (HCC) 06/20/2014  . COPD exacerbation (HCC) 06/20/2014  . Atypical chest pain 06/20/2014  . Hypokalemia 06/20/2014  . Hyperglycemia 06/20/2014  . Essential hypertension 06/20/2007   slightly hypokalemic-recheck labs am Continue inhalers Steroid burst prednisone 40 daily x 5 days Htn better after giving htn meds  potenttially can d/c within 24 hours if all stable   Pleas KochJai Ettamae Barkett, MD [facility] [phone]

## 2015-05-21 NOTE — ED Notes (Signed)
Ambulate the pt 94% RA pt walk from room to the end of the hall and back

## 2015-05-21 NOTE — Clinical Social Work Note (Signed)
CSW was consulted for COPD gold.  CSW reviewed pt chart that did not reflect pt had any prior admission within the last 6 months which is the criteria for COPD gold  CSW signing off.  If there are further needs for CSW please re consult  .Elray Bubaegina Markeda Narvaez, LCSW [facility] Clinical Social Worker - Weekend Coverage cell #: [phone]

## 2015-05-21 NOTE — H&P (Signed)
History and Physical  Patient Name: Ariel InaBrenda A Mott     ZOX:[id number]    DOB: 07/26/1950    DOA: 05/21/2015 Referring physician: Samuel JesterKathleen McManus, MD PCP: Evlyn CourierHILL,GERALD K, MD      Chief Complaint: Coughing and wheezing  HPI: Ariel Martinez is a 64 y.o. female with a past medical history significant for HTN, allergic rhinitis, and mild asthma/COPD who presents with two weeks cough, wheeze and chest tightness.  [facility] the patient walked further than she usually does (a few miles), and ever since, has had chest tightness, cough, and wheezing.  She tried loratadine twice, which helped minimally.  The symptoms are associated with dyspnea, cough (worst at night) and feeling more short of breath and wheezy with exertion.  In the last day the symptoms became too much to manage and so the patient presented to the ER.  In the ED, the patient was tachycardic and tachypneic and very wheezy. She was requiring supplemental oxygen to maintain her oxygen saturation greater than 92%.  A troponin was normal, a chest x-ray showed no focal opacity or pulmonary edema, and the BNP was < 10 pg/mL.       Review of Systems:  Pt complains of chest tightness, cough, wheezing, shortness of breath. Pt denies any fever, chills, sputum, orthopnea worse than normal, paroxysmal nocturnal dyspnea, leg swelling.  All other systems negative except as just noted or noted in the history of present illness.   Allergies: No Known Allergies  Home medications: 1. Amlodipine 10 mg daily 2. Olmesartan 40 mg daily 3. Metoprolol XL 25 mg daily 4. Torsemide 20 mg daily as needed for edema 5. Potassium as needed with torsemide 6. Loratadine 10 mg as needed  Past medical history: 1. Essential hypertension 2. Allergic rhinitis, seasonal 3. Admission for COPD one year ago, no PFTs since. Discharge at that time on a long-acting bronchodilator which she does not take. She also does not have an albuterol  inhaler.  Past surgical history: None  Family history:  Father, asthma (deceased from). Stroke, mother. Grandson, severe allergic asthma.   Social History:  Patient lives independently. She walks without a walker or cane. She is a never smoker. [place] and used to work at [employer].        Physical Exam: BP 126/69 mmHg  Pulse 93  Temp(Src) 98.5 F (36.9 C) (Axillary)  Resp 21  SpO2 97% General appearance: Well-developed, adult female, alert and in mild distress from dyspnea.   Eyes: Anicteric, conjunctiva pink, lids and lashes normal.     ENT: No nasal deformity, discharge, or epistaxis.  OP moist without lesions.   Lymph: No cervical, supraclavicular lymphadenopathy. Skin: Warm and dry.  No jaundice.  No suspicious rashes or lesions. Cardiac: RRR, nl S1-S2, no murmurs appreciated.  JVP not visible due to habitus.  No LE edema.   Respiratory: Normal respiratory rate and rhythm.  Expiratory wheezes prominent throughout. Abdomen: Abdomen soft without rigidity.  No TTP. No ascites, distension.   MSK: No deformities or effusions. Neuro: Sensorium intact and responding to questions, attention normal.  Speech is fluent.  Moves all extremities equally and with normal coordination.    Psych: Behavior appropriate.  Affect normal.  No evidence of aural or visual hallucinations or delusions.       Labs on Admission:  The metabolic panel shows mild hypokalemia. The complete blood count shows no leukocytosis, anemia, thrombocytopenia. The BNP is 9. Grams per meal. The troponin  is negative.    Radiological Exams on Admission: Personally reviewed: Dg Chest Port 1 View  05/21/2015  CLINICAL DATA:  Subacute onset of shortness of breath and cough. Initial encounter. EXAM: PORTABLE CHEST 1 VIEW COMPARISON:  Chest radiograph and CTA of the chest performed 06/20/2014 FINDINGS: The lungs are well-aerated. Mild vascular congestion is noted. Mild bibasilar opacities appear  to reflect previously noted scarring. There is no evidence of pleural effusion or pneumothorax. The cardiomediastinal silhouette is borderline enlarged. No acute osseous abnormalities are seen. IMPRESSION: Mild vascular congestion and borderline cardiomegaly. Mild bibasilar airspace opacities appear to reflect previously noted scarring, without definite pulmonary edema. Electronically Signed   By: Roanna Raider M.D.   On: 05/21/2015 02:11    EKG: Independently reviewed. Normal sinus rhythm.    Assessment/Plan 1. COPD exacerbation:  Asthmatic phenotype.  Was admitted for same 1 year ago. Does not use maintenance or rescue inhaler at home. Has never had PFTs.   -Prednisone 40 mg once daily for 5 days. -Nebulized albuterol every 6 hours and every 2 hours when necessary -Care management, social work, and respiratory therapy consults for education per G[facility] protocol.  2. HTN:  Hypertensive at admission. -Continue olmesartan, amlodipine, and metoprolol -continue statin     DVT PPx: outpatient status, low risk Diet: regular Consultants: none Code Status: full Medical decision making: What exists of the patient's previous chart was reviewed in depth and the case was discussed with Dr. Clarene Duke. Patient seen 6:14 AM on 05/21/2015.  Disposition Plan:  Admit for OBS.  If breathing improved by this afternoon and teaching accomplished, can discharge with follow up either tonight or tomorrow morning.      Alberteen Sam [facility] Pager [phone]

## 2015-05-21 NOTE — ED Notes (Addendum)
Pt short of breath. Inspiratory and expiratory wheezing bilaterally. Hx of asthma

## 2015-05-22 DIAGNOSIS — I10 Essential (primary) hypertension: Secondary | ICD-10-CM | POA: Diagnosis not present

## 2015-05-22 DIAGNOSIS — J45909 Unspecified asthma, uncomplicated: Secondary | ICD-10-CM | POA: Diagnosis not present

## 2015-05-22 DIAGNOSIS — J441 Chronic obstructive pulmonary disease with (acute) exacerbation: Secondary | ICD-10-CM | POA: Diagnosis not present

## 2015-05-22 DIAGNOSIS — Z9849 Cataract extraction status, unspecified eye: Secondary | ICD-10-CM | POA: Diagnosis not present

## 2015-05-22 DIAGNOSIS — E7800 Pure hypercholesterolemia, unspecified: Secondary | ICD-10-CM | POA: Diagnosis not present

## 2015-05-22 DIAGNOSIS — Z79899 Other long term (current) drug therapy: Secondary | ICD-10-CM | POA: Diagnosis not present

## 2015-05-22 DIAGNOSIS — E876 Hypokalemia: Secondary | ICD-10-CM | POA: Diagnosis not present

## 2015-05-22 LAB — BASIC METABOLIC PANEL
ANION GAP: 5 (ref 5–15)
BUN: 21 mg/dL — AB (ref 6–20)
CALCIUM: 9.3 mg/dL (ref 8.9–10.3)
CO2: 27 mmol/L (ref 22–32)
Chloride: 107 mmol/L (ref 101–111)
Creatinine, Ser: 0.77 mg/dL (ref 0.44–1.00)
GFR calc Af Amer: >60 mL/min (ref >60)
GLUCOSE: 148 mg/dL — AB (ref 65–99)
POTASSIUM: 4.8 mmol/L (ref 3.5–5.1)
Sodium: 139 mmol/L (ref 135–145)

## 2015-05-22 LAB — CBC WITH DIFFERENTIAL/PLATELET
BASOS ABS: 0 10*3/uL (ref 0.0–0.1)
BASOS PCT: 0 %
Eosinophils Absolute: 0 10*3/uL (ref 0.0–0.7)
Eosinophils Relative: 0 %
HEMATOCRIT: 39.7 % (ref 36.0–46.0)
HEMOGLOBIN: 12.6 g/dL (ref 12.0–15.0)
Lymphocytes Relative: 10 %
Lymphs Abs: 1.7 10*3/uL (ref 0.7–4.0)
MCH: 30 pg (ref 26.0–34.0)
MCHC: 31.7 g/dL (ref 30.0–36.0)
MCV: 94.5 fL (ref 78.0–100.0)
Monocytes Absolute: 1.5 10*3/uL — ABNORMAL HIGH (ref 0.1–1.0)
Monocytes Relative: 9 %
NEUTROS ABS: 13.9 10*3/uL — AB (ref 1.7–7.7)
NEUTROS PCT: 81 %
Platelets: 249 10*3/uL (ref 150–400)
RBC: 4.2 MIL/uL (ref 3.87–5.11)
RDW: 13.4 % (ref 11.5–15.5)
WBC: 17.1 10*3/uL — AB (ref 4.0–10.5)

## 2015-05-22 MED ORDER — ALBUTEROL SULFATE HFA 108 (90 BASE) MCG/ACT IN AERS: 2.0000 | INHALATION_SPRAY | Freq: Four times a day (QID) | RESPIRATORY_TRACT | Status: AC | PRN

## 2015-05-22 MED ORDER — PREDNISONE 20 MG PO TABS: 40.0000 mg | ORAL_TABLET | Freq: Every day | ORAL | Status: DC

## 2015-05-22 NOTE — Progress Notes (Signed)
Patient converted to albuterol inhaler for discharge. Gave patient a spacer and confirmed patient knew how to use it.

## 2015-05-22 NOTE — Care Management Note (Signed)
Case Management Note  Patient Details  Name: Ariel Martinez MRN: [id number] Date of Birth: 06/27/1951  Subjective/Objective:       COPD with acute exacerbation              Action/Plan:  NCM spoke to pt and states she is able to afford medication. Currently not on oxygen at home. Pt states she is not a smoker.    Expected Discharge Date:  05/22/2015               Expected Discharge Plan:  Home/Self Care  In-House Referral:     Discharge planning Services  CM Consult   Status of Service:  Completed, signed off  Medicare Important Message Given:    Date Medicare IM Given:    Medicare IM give by:    Date Additional Medicare IM Given:    Additional Medicare Important Message give by:     If discussed at Long Length of Stay Meetings, dates discussed:    Additional Comments:  Elliot CousinShavis, Judd Mccubbin Ellen, RN 05/22/2015, 1:26 PM

## 2015-05-22 NOTE — Care Management Obs Status (Signed)
MEDICARE OBSERVATION STATUS NOTIFICATION   Patient Details  Name: Ariel Martinez MRN: [id number] Date of Birth: Nov 26, 1950   Medicare Observation Status Notification Given:  Yes    Elliot CousinShavis, Nikeshia Keetch Ellen, RN 05/22/2015, 1:25 PM

## 2015-05-22 NOTE — Discharge Summary (Signed)
Physician Discharge Summary  Ariel Martinez ZOX:[id number] DOB: [birth year] DOA: 05/21/2015  PCP: Evlyn Courier, MD  Admit date: 05/21/2015 Discharge date: 05/22/2015  Time spent: 40 minutes  Recommendations for Outpatient Follow-up:  1. Needs daily use albuterol for about 1-2 weeks-refills given and educati by respiratory therapy in the hospital regarding use 2. Patient was insistent on discharge and she had a social event to follow-up with on 11/14. She also has an appointment with Dr. Loleta Chance at that time and discharge follow-up with him 3. Patient will be given a limited prescription of prednisone 14 tablets for 7 day burst 40 mg total dose a day to take further teaching to be performed as an outpatient 4.  consider lab for chem 12+ CBC in about one week    Discharge Diagnoses:  Principal Problem:   COPD with acute exacerbation (HCC) Active Problems:   Essential hypertension   COPD exacerbation (HCC)   Hypokalemia   Discharge Conditioimproved recommenHeart healthy  Filed Weights   05/21/15 0628  Weight: 88.724 kg (195 lb 9.6 oz)    History of present illness:  64 year female   hypertension, rhinitis, asthma admitted with two-week history of coughno known  Admitted to the hospital with tachypnea, tachycardia and very wheezy and was requiring oxygen supplementation to maintain sats above 90%. Chest x-ray showed no focal posterior edema and BNP was less than 10. Patient was admitted for an acute asthma exacerbation She does not use inhalers at baseline usually She was given Solu-Medrol and seemed to have some improvement and was insistent on discharge on 05/22/15 and she had a social event She was given a burst of steroids 40 mg daily for 7 days and also given a prescription for albuterol and was suggested that she learned from respiratory therapy how to use inhaler and how to use peak flow meter. She may benefit from outpatient pulmonary referral dependent on input from primary  care physician  patient was hemodynamically stable on discharge  Accuchecks 4 times/day, Once in AM empty stomach and then before each meal. Log in all results and show them to your Prim.MD in 3 days. If any glucose reading is under 80 or above 300 call your Prim MD immidiately. Follow Low glucose instructions for glucose under 80 as instructed.  For Heart failure patients - Check your Weight same time everyday, if you gain over 2 pounds, or you develop in leg swelling, experience more shortness of breath or chest pain, call your Primary MD immediately. Follow Cardiac Low Salt Diet and 1.5 lit/day fluid restriction.   On your next visit with your primary care physician please Get Medicines reviewed and adjusted.   Please request your Prim.MD to go over all Hospital Tests and Procedure/Radiological results at the follow up, please get all Hospital records sent to your Prim MD by signing hospital release before you go home.   If you experience worsening of your admission symptoms, develop shortness of breath, life threatening emergency, suicidal or homicidal thoughts you must seek medical attention immediately by calling 911 or calling your MD immediately if symptoms less severe.  You Must read complete instructions/literature along with all the possible adverse reactions/side effects for all the Medicines you take and that have been prescribed to you. Take any new Medicines after you have completely understood and accpet all the possible adverse reactions/side effects.   Do not drive, operating heavy machinery, perform activities at heights, swimming or participation in water activities or provide baby sitting services if  your were admitted for syncope or siezures until you have seen by Primary MD or a Neurologist and advised to do so again.  Do not drive when taking Pain medications.    Do not take more than prescribed Pain, Sleep and Anxiety Medications  Special Instructions: If you  have smoked or chewed Tobacco in the last 2 yrs please stop smoking, stop any regular Alcohol and or any Recreational drug use.  Wear Seat belts while driving.   Please note  You were cared for by a hospitalist during your hospital stay. If you have any questions about your discharge medications or the care you received while you were in the hospital after you are discharged, you can call the unit and asked to speak with the hospitalist on call if the hospitalist that took care of you is not available. Once you are discharged, your primary care physician will handle any further medical issues. Please note that NO REFILLS for any discharge medications will be authorized once you are discharged, as it is imperative that you return to your primary care physician (or establish a relationship with a primary care physician if you do not have one) for your aftercare needs so that they can reassess your need for medications and monitor your lab values.  Discharge Exam: Filed Vitals:   05/22/15 0556  BP: 124/68  Pulse: 75  Temp: 97.7 F (36.5 C)  Resp: 19    General:    alert and orientedno apparent distress tolerating diet  Cardiovascular:  S1-S2 no murmur rub or gallop  Wheezy throughout but decreased from admission  No lower extremity edema   Discharge Instructions    Current Discharge Medication List    START taking these medications   Details  albuterol (PROVENTIL HFA;VENTOLIN HFA) 108 (90 BASE) MCG/ACT inhaler Inhale 2 puffs into the lungs every 6 (six) hours as needed for wheezing or shortness of breath. Qty: 1 Inhaler, Refills: 2    predniSONE (DELTASONE) 20 MG tablet Take 2 tablets (40 mg total) by mouth daily with breakfast. Qty: 14 tablet, Refills: 0      CONTINUE these medications which have NOT CHANGED   Details  AZOR 10-40 MG tablet Take 1 tablet by mouth daily. Refills: 0    KLOR-CON M10 10 MEQ tablet Take 10 mEq by mouth daily as needed (only when taking with  torsemide).  Refills: 1    loratadine (CLARITIN) 10 MG tablet Take 10 mg by mouth daily.    metoprolol succinate (TOPROL-XL) 25 MG 24 hr tablet Take 25 mg by mouth daily. Refills: 2    simvastatin (ZOCOR) 20 MG tablet Take 20 mg by mouth daily.    torsemide (DEMADEX) 20 MG tablet Take 20 mg by mouth daily as needed (edema).  Refills: 3       No Known Allergies    The results of significant diagnostics from this hospitalization (including imaging, microbiology, ancillary and laboratory) are listed below for reference.    Significant Diagnostic Studies: Dg Chest Port 1 View  05/21/2015  CLINICAL DATA:  Subacute onset of shortness of breath and cough. Initial encounter. EXAM: PORTABLE CHEST 1 VIEW COMPARISON:  Chest radiograph and CTA of the chest performed 06/20/2014 FINDINGS: The lungs are well-aerated. Mild vascular congestion is noted. Mild bibasilar opacities appear to reflect previously noted scarring. There is no evidence of pleural effusion or pneumothorax. The cardiomediastinal silhouette is borderline enlarged. No acute osseous abnormalities are seen. IMPRESSION: Mild vascular congestion and borderline cardiomegaly. Mild bibasilar  airspace opacities appear to reflect previously noted scarring, without definite pulmonary edema. Electronically Signed   By: Roanna RaiderJeffery  Chang M.D.   On: 05/21/2015 02:11    Microbiology: No results found for this or any previous visit (from the past 240 hour(s)).   Labs: Basic Metabolic Panel:  Recent Labs Lab 05/21/15 0227 05/22/15 0550  NA 137 139  K 3.3* 4.8  CL 103 107  CO2 26 27  GLUCOSE 197* 148*  BUN 12 21*  CREATININE 0.80 0.77  CALCIUM 8.8* 9.3   Liver Function Tests: No results for input(s): AST, ALT, ALKPHOS, BILITOT, PROT, ALBUMIN in the last 168 hours. No results for input(s): LIPASE, AMYLASE in the last 168 hours. No results for input(s): AMMONIA in the last 168 hours. CBC:  Recent Labs Lab 05/21/15 0227  05/22/15 0550  WBC 6.4 17.1*  NEUTROABS 2.9 13.9*  HGB 13.4 12.6  HCT 40.5 39.7  MCV 91.8 94.5  PLT 210 249   Cardiac Enzymes:  Recent Labs Lab 05/21/15 0227  TROPONINI <0.03   BNP: BNP (last 3 results)  Recent Labs  05/21/15 0227  BNP 9.0    ProBNP (last 3 results)  Recent Labs  06/20/14 0645  PROBNP 13.6    CBG: No results for input(s): GLUCAP in the last 168 hours.     SignedRhetta Martinez:  Ariel Martinez, Ariel Martinez  [facility] 05/22/2015, 9:27 AM

## 2015-05-22 NOTE — Progress Notes (Signed)
Went over d/c instructions with patient.  Patient verbalized understanding.  Patient left hospital with via w/c and personal vehicle. Levora AngelHannah V Justn Quale, RN

## 2015-05-23 DIAGNOSIS — I10 Essential (primary) hypertension: Secondary | ICD-10-CM | POA: Diagnosis not present

## 2015-05-23 DIAGNOSIS — Z6836 Body mass index (BMI) 36.0-36.9, adult: Secondary | ICD-10-CM | POA: Diagnosis not present

## 2015-05-23 DIAGNOSIS — J441 Chronic obstructive pulmonary disease with (acute) exacerbation: Secondary | ICD-10-CM | POA: Diagnosis not present

## 2015-06-01 DIAGNOSIS — J449 Chronic obstructive pulmonary disease, unspecified: Secondary | ICD-10-CM | POA: Diagnosis not present

## 2015-06-01 DIAGNOSIS — I10 Essential (primary) hypertension: Secondary | ICD-10-CM | POA: Diagnosis not present

## 2015-06-25 ENCOUNTER — Emergency Department (HOSPITAL_COMMUNITY): Payer: Commercial Managed Care - HMO

## 2015-06-25 ENCOUNTER — Encounter (HOSPITAL_COMMUNITY): Payer: Self-pay | Admitting: Emergency Medicine

## 2015-06-25 ENCOUNTER — Emergency Department (HOSPITAL_COMMUNITY)
Admission: EM | Admit: 2015-06-25 | Discharge: 2015-06-25 | Disposition: A | Discharge status: Home / Self Care | Payer: Commercial Managed Care - HMO | Attending: Emergency Medicine | Admitting: Emergency Medicine

## 2015-06-25 DIAGNOSIS — I10 Essential (primary) hypertension: Secondary | ICD-10-CM | POA: Insufficient documentation

## 2015-06-25 DIAGNOSIS — J441 Chronic obstructive pulmonary disease with (acute) exacerbation: Secondary | ICD-10-CM | POA: Insufficient documentation

## 2015-06-25 DIAGNOSIS — E7800 Pure hypercholesterolemia, unspecified: Secondary | ICD-10-CM | POA: Insufficient documentation

## 2015-06-25 DIAGNOSIS — Z79899 Other long term (current) drug therapy: Secondary | ICD-10-CM | POA: Insufficient documentation

## 2015-06-25 DIAGNOSIS — J069 Acute upper respiratory infection, unspecified: Secondary | ICD-10-CM | POA: Insufficient documentation

## 2015-06-25 DIAGNOSIS — Z7982 Long term (current) use of aspirin: Secondary | ICD-10-CM | POA: Insufficient documentation

## 2015-06-25 DIAGNOSIS — R0602 Shortness of breath: Secondary | ICD-10-CM | POA: Diagnosis not present

## 2015-06-25 DIAGNOSIS — R05 Cough: Secondary | ICD-10-CM | POA: Diagnosis not present

## 2015-06-25 MED ORDER — AEROCHAMBER PLUS W/MASK MISC
1.0000 | Freq: Once | Status: AC
  Administered 2015-06-25: 1
  Filled 2015-06-25: qty 1

## 2015-06-25 MED ORDER — BENZONATATE 100 MG PO CAPS: 100.0000 mg | ORAL_CAPSULE | Freq: Three times a day (TID) | ORAL | Status: AC

## 2015-06-25 MED ORDER — PREDNISONE 20 MG PO TABS: ORAL_TABLET | ORAL | Status: AC

## 2015-06-25 MED ORDER — PREDNISONE 20 MG PO TABS
40.0000 mg | ORAL_TABLET | Freq: Once | ORAL | Status: AC
  Administered 2015-06-25: 40 mg via ORAL
  Filled 2015-06-25: qty 2

## 2015-06-25 MED ORDER — BENZONATATE 100 MG PO CAPS
100.0000 mg | ORAL_CAPSULE | Freq: Once | ORAL | Status: AC
  Administered 2015-06-25: 100 mg via ORAL
  Filled 2015-06-25: qty 1

## 2015-06-25 MED ORDER — IPRATROPIUM-ALBUTEROL 0.5-2.5 (3) MG/3ML IN SOLN
3.0000 mL | RESPIRATORY_TRACT | Status: AC
  Administered 2015-06-25 (×3): 3 mL via RESPIRATORY_TRACT
  Filled 2015-06-25: qty 6
  Filled 2015-06-25: qty 3

## 2015-06-25 NOTE — ED Notes (Signed)
Pt demonstrating how to use inhaler with spacer, MD verbal order for pt to admin 4-6 Albuterol puffs, MD at bedside

## 2015-06-25 NOTE — ED Notes (Signed)
Pt sts increased SOB from asthma x 2 days with productive cough

## 2015-06-25 NOTE — Discharge Instructions (Signed)
User inhaler every 4 hours while awake. Return for sudden worsening shortness of breath, chest pain, fevers, or needing to use her inhaler more often than every 4 hours. Chronic Obstructive Pulmonary Disease Exacerbation Chronic obstructive pulmonary disease (COPD) is a common lung condition in which airflow from the lungs is limited. COPD is a general term that can be used to describe many different lung problems that limit airflow, including chronic bronchitis and emphysema. COPD exacerbations are episodes when breathing symptoms become much worse and require extra treatment. Without treatment, COPD exacerbations can be life threatening, and frequent COPD exacerbations can cause further damage to your lungs. CAUSES  Respiratory infections.  Exposure to smoke.  Exposure to air pollution, chemical fumes, or dust. Sometimes there is no apparent cause or trigger. RISK FACTORS  Smoking cigarettes.  Older age.  Frequent prior COPD exacerbations. SIGNS AND SYMPTOMS  Increased coughing.  Increased thick spit (sputum) production.  Increased wheezing.  Increased shortness of breath.  Rapid breathing.  Chest tightness. DIAGNOSIS Your medical history, a physical exam, and tests will help your health care provider make a diagnosis. Tests may include:  A chest X-ray.  Basic lab tests.  Sputum testing.  An arterial blood gas test. TREATMENT Depending on the severity of your COPD exacerbation, you may need to be admitted to a hospital for treatment. Some of the treatments commonly used to treat COPD exacerbations are:   Antibiotic medicines.  Bronchodilators. These are drugs that expand the air passages. They may be given with an inhaler or nebulizer. Spacer devices may be needed to help improve drug delivery.  Corticosteroid medicines.  Supplemental oxygen therapy.  Airway clearing techniques, such as noninvasive ventilation (NIV) and positive expiratory pressure (PEP). These  provide respiratory support through a mask or other noninvasive device. HOME CARE INSTRUCTIONS  Do not smoke. Quitting smoking is very important to prevent COPD from getting worse and exacerbations from happening as often.  Avoid exposure to all substances that irritate the airway, especially to tobacco smoke.  If you were prescribed an antibiotic medicine, finish it all even if you start to feel better.  Take all medicines as directed by your health care provider.It is important to use correct technique with inhaled medicines.  Drink enough fluids to keep your urine clear or pale yellow (unless you have a medical condition that requires fluid restriction).  Use a cool mist vaporizer. This makes it easier to clear your chest when you cough.  If you have a home nebulizer and oxygen, continue to use them as directed.  Maintain all necessary vaccinations to prevent infections.  Exercise regularly.  Eat a healthy diet.  Keep all follow-up appointments as directed by your health care provider. SEEK IMMEDIATE MEDICAL CARE IF:  You have worsening shortness of breath.  You have trouble talking.  You have severe chest pain.  You have blood in your sputum.  You have a fever.  You have weakness, vomit repeatedly, or faint.  You feel confused.  You continue to get worse. MAKE SURE YOU:  Understand these instructions.  Will watch your condition.  Will get help right away if you are not doing well or get worse.   This information is not intended to replace advice given to you by your health care provider. Make sure you discuss any questions you have with your health care provider.   Document Released: 04/22/2007 Document Revised: 07/16/2014 Document Reviewed: 02/27/2013 Elsevier Interactive Patient Education [employer]2016 [employer]

## 2015-06-25 NOTE — ED Provider Notes (Signed)
CSN: [id number]     Arrival date & time 06/25/15  1051 History   First MD Initiated Contact with Patient 06/25/15 1056     Chief Complaint  Patient presents with  . Shortness of Breath     (Consider location/radiation/quality/duration/timing/severity/associated sxs/prior Treatment) Patient is a 64 y.o. female presenting with shortness of breath. The history is provided by the patient.  Shortness of Breath Severity:  Moderate Onset quality:  Gradual Duration:  2 days Timing:  Constant Progression:  Worsening Chronicity:  Recurrent Context: URI   Relieved by:  Nothing Worsened by:  Nothing tried Ineffective treatments:  Inhaler Associated symptoms: cough and wheezing   Associated symptoms: no chest pain, no fever, no headaches and no vomiting     64 yo F with a chief complaint shortness of breath. This been going on for the past couple days. Patient states she has a history of asthma and been worsened by upper risk for infection that she got from her son. Denies fevers or chills denies chest pain. Patient had a be hospitalized couple different times for this over the past couple months. Last finished her prednisone more than a couple weeks ago. Denies running out of her inhaler at home. Has been using that with minimal relief. Denies history of PE.  Past Medical History  Diagnosis Date  . Hypertension   . High cholesterol   . Heart disease   . COPD (chronic obstructive pulmonary disease) [facility](HCC)    Past Surgical History  Procedure Laterality Date  . Cataract extraction     Family History  Problem Relation Age of Onset  . Stroke Mother   . Asthma Father   . Asthma Grandchild    Social History  Substance Use Topics  . Smoking status: Never Smoker   . Smokeless tobacco: None  . Alcohol Use: No   OB History    No data available     Review of Systems  Constitutional: Negative for fever and chills.  HENT: Negative for congestion and rhinorrhea.   Eyes: Negative for  redness and visual disturbance.  Respiratory: Positive for cough, shortness of breath and wheezing.   Cardiovascular: Negative for chest pain and palpitations.  Gastrointestinal: Negative for nausea and vomiting.  Genitourinary: Negative for dysuria and urgency.  Musculoskeletal: Negative for myalgias and arthralgias.  Skin: Negative for pallor and wound.  Neurological: Negative for dizziness and headaches.      Allergies  Review of patient's allergies indicates no known allergies.  Home Medications   Prior to Admission medications   Medication Sig Start Date End Date Taking? Authorizing Provider  albuterol (PROVENTIL HFA;VENTOLIN HFA) 108 (90 BASE) MCG/ACT inhaler Inhale 2 puffs into the lungs every 6 (six) hours as needed for wheezing or shortness of breath. 05/22/15  Yes Rhetta MuraJai-Gurmukh Samtani, MD  aspirin 81 MG tablet Take 81 mg by mouth daily.   Yes Historical Provider, MD  AZOR 10-40 MG tablet Take 1 tablet by mouth daily. 04/09/15  Yes Historical Provider, MD  KLOR-CON M10 10 MEQ tablet Take 10 mEq by mouth daily as needed (only when taking with torsemide).  03/24/14  Yes Historical Provider, MD  loratadine (CLARITIN) 10 MG tablet Take 10 mg by mouth daily.   Yes Historical Provider, MD  metoprolol succinate (TOPROL-XL) 25 MG 24 hr tablet Take 25 mg by mouth daily. 04/09/14  Yes Historical Provider, MD  simvastatin (ZOCOR) 20 MG tablet Take 20 mg by mouth daily.   Yes Historical Provider, MD  torsemide [facility](DEMADEX)  20 MG tablet Take 20 mg by mouth daily as needed (edema).  04/09/14  Yes Historical Provider, MD  benzonatate (TESSALON) 100 MG capsule Take 1 capsule (100 mg total) by mouth every 8 (eight) hours. 06/25/15   Melene Plan, DO  predniSONE (DELTASONE) 20 MG tablet 2 tabs po daily x 4 days 06/25/15   Melene Plan, DO   BP 129/91 mmHg  Pulse 109  Temp(Src) 97.8 F (36.6 C) (Oral)  Resp 20  SpO2 96% Physical Exam  Constitutional: She is oriented to person, place, and time. She  appears well-developed and well-nourished. No distress.  HENT:  Head: Normocephalic and atraumatic.  Eyes: EOM are normal. Pupils are equal, round, and reactive to light.  Neck: Normal range of motion. Neck supple.  Cardiovascular: Normal rate and regular rhythm.  Exam reveals no gallop and no friction rub.   No murmur heard. Pulmonary/Chest: Effort normal. She has wheezes (diffuse inspiratory and expiratory wheezes throughout prolonged expiration). She has no rales.  Abdominal: Soft. She exhibits no distension. There is no tenderness. There is no rebound and no guarding.  Musculoskeletal: She exhibits no edema or tenderness.  Neurological: She is alert and oriented to person, place, and time.  Skin: Skin is warm and dry. She is not diaphoretic.  Psychiatric: She has a normal mood and affect. Her behavior is normal.  Nursing note and vitals reviewed.   ED Course  Procedures (including critical care time) Labs Review Labs Reviewed - No data to display  Imaging Review Dg Chest 2 View  06/25/2015  CLINICAL DATA:  Shortness of breath and cough. EXAM: CHEST - 2 VIEW COMPARISON:  05/21/2015 FINDINGS: The heart size is stable and normal. Stable tortuosity of the thoracic aorta present. Stable bibasilar scarring. There is no evidence of pulmonary edema, consolidation, pneumothorax, nodule or pleural fluid. The visualized skeletal structures are unremarkable. IMPRESSION: No active disease. Electronically Signed   By: Irish Lack M.D.   On: 06/25/2015 13:11   I have personally reviewed and evaluated these images and lab results as part of my medical decision-making.   EKG Interpretation   Date/Time:  Saturday June 25 2015 11:41:57 EST Ventricular Rate:  104 PR Interval:  147 QRS Duration: 87 QT Interval:  336 QTC Calculation: 442 R Axis:   21 Text Interpretation:  Sinus tachycardia No significant change since last  tracing Confirmed by Kaisey Huseby MD, DANIEL ([id number]) on 06/25/2015  11:47:25 AM      MDM   Final diagnoses:  COPD with acute exacerbation (HCC)  URI (upper respiratory infection)    64 yo F with a chief complaint of shortness of breath. Patient has a history of asthma appears to be having exacerbation. Well-appearing with no retractions on initial exam. Patient with diffuse wheezes. Will attempt 3 DuoNeb's back-to-back and prednisone reassess.  Patient feeling much better, much improved work of breathing.  CXR negative for pna.  Will start on tessalon, breathing treatments every 4 hours while awake, burst dose steroids.  PCP follow up.  1:23 PM:  I have discussed the diagnosis/risks/treatment options with the patient and believe the pt to be eligible for discharge home to follow-up with PCP. We also discussed returning to the ED immediately if new or worsening sx occur. We discussed the sx which are most concerning (e.g., sudden worsening sob, fever, needing to use inhaler more often than every 4 hours) that necessitate immediate return. Medications administered to the patient during their visit and any new prescriptions provided to the patient  are listed below.  Medications given during this visit Medications  aerochamber plus with mask device 1 each (not administered)  benzonatate (TESSALON) capsule 100 mg (not administered)  ipratropium-albuterol (DUONEB) 0.5-2.5 (3) MG/3ML nebulizer solution 3 mL (3 mLs Nebulization Given 06/25/15 1208)  predniSONE (DELTASONE) tablet 40 mg (40 mg Oral Given 06/25/15 1110)    New Prescriptions   BENZONATATE (TESSALON) 100 MG CAPSULE    Take 1 capsule (100 mg total) by mouth every 8 (eight) hours.   PREDNISONE (DELTASONE) 20 MG TABLET    2 tabs po daily x 4 days    The patient appears reasonably screen and/or stabilized for discharge and I doubt any other medical condition or other [facility] requiring further screening, evaluation, or treatment in the ED at this time prior to discharge.    Melene Plan, DO 06/25/15 1323

## 2015-06-25 NOTE — ED Notes (Signed)
Pt educated on how to use Advair diskus, pt verbalizes & demonstrates understanding

## 2015-06-27 DIAGNOSIS — J449 Chronic obstructive pulmonary disease, unspecified: Secondary | ICD-10-CM | POA: Diagnosis not present

## 2015-07-01 ENCOUNTER — Emergency Department (HOSPITAL_COMMUNITY)
Admission: EM | Admit: 2015-07-01 | Discharge: 2015-07-01 | Disposition: A | Discharge status: Home / Self Care | Payer: Commercial Managed Care - HMO | Attending: Emergency Medicine | Admitting: Emergency Medicine

## 2015-07-01 ENCOUNTER — Encounter (HOSPITAL_COMMUNITY): Payer: Self-pay | Admitting: Emergency Medicine

## 2015-07-01 ENCOUNTER — Emergency Department (HOSPITAL_COMMUNITY): Payer: Commercial Managed Care - HMO

## 2015-07-01 DIAGNOSIS — Z7951 Long term (current) use of inhaled steroids: Secondary | ICD-10-CM | POA: Insufficient documentation

## 2015-07-01 DIAGNOSIS — L03012 Cellulitis of left finger: Secondary | ICD-10-CM | POA: Diagnosis not present

## 2015-07-01 DIAGNOSIS — I10 Essential (primary) hypertension: Secondary | ICD-10-CM | POA: Insufficient documentation

## 2015-07-01 DIAGNOSIS — Z7952 Long term (current) use of systemic steroids: Secondary | ICD-10-CM | POA: Diagnosis not present

## 2015-07-01 DIAGNOSIS — J449 Chronic obstructive pulmonary disease, unspecified: Secondary | ICD-10-CM | POA: Diagnosis not present

## 2015-07-01 DIAGNOSIS — Z79899 Other long term (current) drug therapy: Secondary | ICD-10-CM | POA: Diagnosis not present

## 2015-07-01 DIAGNOSIS — Z792 Long term (current) use of antibiotics: Secondary | ICD-10-CM | POA: Insufficient documentation

## 2015-07-01 DIAGNOSIS — L03211 Cellulitis of face: Secondary | ICD-10-CM | POA: Diagnosis not present

## 2015-07-01 DIAGNOSIS — E7800 Pure hypercholesterolemia, unspecified: Secondary | ICD-10-CM | POA: Diagnosis not present

## 2015-07-01 DIAGNOSIS — R220 Localized swelling, mass and lump, head: Secondary | ICD-10-CM | POA: Diagnosis present

## 2015-07-01 DIAGNOSIS — L0201 Cutaneous abscess of face: Secondary | ICD-10-CM | POA: Diagnosis not present

## 2015-07-01 LAB — BASIC METABOLIC PANEL
ANION GAP: 11 (ref 5–15)
BUN: 15 mg/dL (ref 6–20)
CHLORIDE: 101 mmol/L (ref 101–111)
CO2: 28 mmol/L (ref 22–32)
CREATININE: 0.73 mg/dL (ref 0.44–1.00)
Calcium: 9 mg/dL (ref 8.9–10.3)
GFR calc non Af Amer: >60 mL/min (ref >60)
Glucose, Bld: 126 mg/dL — ABNORMAL HIGH (ref 65–99)
POTASSIUM: 3.7 mmol/L (ref 3.5–5.1)
SODIUM: 140 mmol/L (ref 135–145)

## 2015-07-01 LAB — CBC WITH DIFFERENTIAL/PLATELET
BASOS ABS: 0 10*3/uL (ref 0.0–0.1)
BASOS PCT: 0 %
EOS ABS: 0.1 10*3/uL (ref 0.0–0.7)
Eosinophils Relative: 1 %
HCT: 44.3 % (ref 36.0–46.0)
HEMOGLOBIN: 14.3 g/dL (ref 12.0–15.0)
Lymphocytes Relative: 16 %
Lymphs Abs: 1.9 10*3/uL (ref 0.7–4.0)
MCH: 30.4 pg (ref 26.0–34.0)
MCHC: 32.3 g/dL (ref 30.0–36.0)
MCV: 94.3 fL (ref 78.0–100.0)
Monocytes Absolute: 1.1 10*3/uL — ABNORMAL HIGH (ref 0.1–1.0)
Monocytes Relative: 9 %
NEUTROS ABS: 8.8 10*3/uL — AB (ref 1.7–7.7)
NEUTROS PCT: 74 %
Platelets: 277 10*3/uL (ref 150–400)
RBC: 4.7 MIL/uL (ref 3.87–5.11)
RDW: 13.1 % (ref 11.5–15.5)
WBC: 11.9 10*3/uL — AB (ref 4.0–10.5)

## 2015-07-01 LAB — CBG MONITORING, ED: GLUCOSE-CAPILLARY: 134 mg/dL — AB (ref 65–99)

## 2015-07-01 MED ORDER — CLINDAMYCIN HCL 150 MG PO CAPS: 450.0000 mg | ORAL_CAPSULE | Freq: Three times a day (TID) | ORAL | Status: AC

## 2015-07-01 MED ORDER — LIDOCAINE HCL (PF) 1 % IJ SOLN
5.0000 mL | Freq: Once | INTRAMUSCULAR | Status: AC
Start: 2015-07-01 — End: 2015-07-01
  Administered 2015-07-01: 5 mL via INTRADERMAL
  Filled 2015-07-01: qty 5

## 2015-07-01 MED ORDER — IOHEXOL 300 MG/ML  SOLN
75.0000 mL | Freq: Once | INTRAMUSCULAR | Status: AC | PRN
  Administered 2015-07-01: 75 mL via INTRAVENOUS

## 2015-07-01 MED ORDER — LIDOCAINE HCL (PF) 1 % IJ SOLN
5.0000 mL | Freq: Once | INTRAMUSCULAR | Status: DC
Start: 2015-07-01 — End: 2015-07-01
  Filled 2015-07-01: qty 5

## 2015-07-01 NOTE — ED Notes (Signed)
Pt comes to ed with a allergic reaction to newly prescribed antibiotic Amox-Clav 875-125Mg . Pt took new meds and noticed facial edema and hand edema.  No breathing issues, no oral edema. Primary doctor gerald hill doctor referred pt to Ed for allergic reaction. Triage vitals 98.2 temp, Hr 104, 96 room air and Bp 185/ 105. Pt has a history of HTN, and currently taking prednisone.

## 2015-07-01 NOTE — ED Notes (Signed)
Ortho Tech called for finger splint.

## 2015-07-01 NOTE — ED Provider Notes (Signed)
CSN: [id number]     Arrival date & time 07/01/15  1255 History   First MD Initiated Contact with Patient 07/01/15 1319     Chief Complaint  Patient presents with  . Allergic Reaction     (Consider location/radiation/quality/duration/timing/severity/associated sxs/prior Treatment) The history is provided by the patient and medical records. No language interpreter was used.     Ariel Martinez is a 64 y.o. female  with a hx of HTN, COPD presents to the Emergency Department complaining of gradual, persistent, progressively worsening left sided facial swelling onset yesterday morning after awaking from sleep.  Pt reports she was seen at her PCP on Monday and begun on Augmentin for sinusitis.  She reports improvement of the swelling to the face since yesterday.  Pt reports she is taking oral steroids, albuterol and advair after being in the ED on Saturday, 6 days ago.  Pt also reports swelling of her left thumb onset yesterday morning as well.  She denies hx of diabetes. She denies fever, chills, vision, changes, difficulty breathing, abd pain, N/V/D, weakness, dizziness, syncope, rash.  Patient is unilateral face swelling, no feeling of throat closing.  Patient denies biting her nails or fingers. She denies injury to the left thumb.  Past Medical History  Diagnosis Date  . Hypertension   . High cholesterol   . Heart disease   . COPD (chronic obstructive pulmonary disease) [facility])    Past Surgical History  Procedure Laterality Date  . Cataract extraction     Family History  Problem Relation Age of Onset  . Stroke Mother   . Asthma Father   . Asthma Grandchild    Social History  Substance Use Topics  . Smoking status: Never Smoker   . Smokeless tobacco: None  . Alcohol Use: No   OB History    No data available     Review of Systems  Constitutional: Negative for fever, diaphoresis, appetite change, fatigue and unexpected weight change.  HENT: Negative for mouth sores.   Eyes:  Negative for visual disturbance.  Respiratory: Negative for cough, chest tightness, shortness of breath and wheezing.   Cardiovascular: Negative for chest pain.  Gastrointestinal: Negative for nausea, vomiting, abdominal pain, diarrhea and constipation.  Endocrine: Negative for polydipsia, polyphagia and polyuria.  Genitourinary: Negative for dysuria, urgency, frequency and hematuria.  Musculoskeletal: Negative for back pain and neck stiffness.  Skin: Positive for color change and wound. Negative for rash.  Allergic/Immunologic: Negative for immunocompromised state.  Neurological: Negative for syncope, light-headedness and headaches.  Hematological: Does not bruise/bleed easily.  Psychiatric/Behavioral: Negative for sleep disturbance. The patient is not nervous/anxious.       Allergies  Review of patient's allergies indicates no known allergies.  Home Medications   Prior to Admission medications   Medication Sig Start Date End Date Taking? Authorizing Provider  albuterol (PROVENTIL HFA;VENTOLIN HFA) 108 (90 BASE) MCG/ACT inhaler Inhale 2 puffs into the lungs every 6 (six) hours as needed for wheezing or shortness of breath. 05/22/15  Yes Rhetta Mura, MD  amoxicillin-clavulanate (AUGMENTIN) 875-125 MG tablet Take 1 tablet by mouth 2 (two) times daily.   Yes Historical Provider, MD  aspirin 81 MG tablet Take 81 mg by mouth daily.   Yes Historical Provider, MD  AZOR 10-40 MG tablet Take 1 tablet by mouth daily. 04/09/15  Yes Historical Provider, MD  Fluticasone-Salmeterol (ADVAIR) 250-50 MCG/DOSE AEPB Inhale 1 puff into the lungs 2 (two) times daily.   Yes Historical Provider, MD  KLOR-CON M10  10 MEQ tablet Take 10 mEq by mouth daily as needed (only when taking with torsemide).  03/24/14  Yes Historical Provider, MD  loratadine (CLARITIN) 10 MG tablet Take 10 mg by mouth daily.   Yes Historical Provider, MD  metoprolol succinate (TOPROL-XL) 25 MG 24 hr tablet Take 25 mg by mouth  daily. 04/09/14  Yes Historical Provider, MD  predniSONE (DELTASONE) 20 MG tablet 2 tabs po daily x 4 days Patient taking differently: Take 10 mg by mouth daily.  06/25/15  Yes Melene Planan Floyd, DO  simvastatin (ZOCOR) 20 MG tablet Take 20 mg by mouth daily.   Yes Historical Provider, MD  torsemide (DEMADEX) 20 MG tablet Take 20 mg by mouth daily as needed (edema).  04/09/14  Yes Historical Provider, MD  benzonatate (TESSALON) 100 MG capsule Take 1 capsule (100 mg total) by mouth every 8 (eight) hours. 06/25/15   Melene Planan Floyd, DO  clindamycin (CLEOCIN) 150 MG capsule Take 3 capsules (450 mg total) by mouth 3 (three) times daily. 07/01/15   Milady Fleener, PA-C   BP 140/84 mmHg  Pulse 89  Temp(Src) 98.5 F (36.9 C) (Oral)  Resp 20 Physical Exam  Constitutional: She appears well-developed and well-nourished. No distress.  Awake, alert, nontoxic appearance  HENT:  Head: Normocephalic and atraumatic.  Nose: Nose normal.  Mouth/Throat: Oropharynx is clear and moist. No oropharyngeal exudate.  Area of swelling and induration to the left face just lateral to the left nare and extending over the entire zygomatic process without extension into the eye or down into the mandible  Eyes: Conjunctivae are normal. No scleral icterus.  Neck: Normal range of motion. Neck supple.  Cardiovascular: Normal rate, regular rhythm, normal heart sounds and intact distal pulses.   No murmur heard. Pulmonary/Chest: Effort normal and breath sounds normal. No respiratory distress. She has no wheezes.  Equal chest expansion  Abdominal: Soft. Bowel sounds are normal. She exhibits no mass. There is no tenderness. There is no rebound and no guarding.  Musculoskeletal: Normal range of motion. She exhibits no edema.  Paronychia of the left thumb with tenderness to palpation  Neurological: She is alert.  Speech is clear and goal oriented Moves extremities without ataxia  Skin: Skin is warm and dry. She is not diaphoretic.   Psychiatric: She has a normal mood and affect.  Nursing note and vitals reviewed.   ED Course  .Marland Kitchen.Incision and Drainage Date/Time: 07/01/2015 3:35 PM Performed by: Dierdre ForthMUTHERSBAUGH, Nikie Cid Authorized by: Dierdre ForthMUTHERSBAUGH, Latif Nazareno Consent: Verbal consent obtained. Risks and benefits: risks, benefits and alternatives were discussed Consent given by: patient Patient understanding: patient states understanding of the procedure being performed Patient consent: the patient's understanding of the procedure matches consent given Procedure consent: procedure consent matches procedure scheduled Relevant documents: relevant documents present and verified Site marked: the operative site was marked Required items: required blood products, implants, devices, and special equipment available Patient identity confirmed: verbally with patient and arm band Time out: Immediately prior to procedure a "time out" was called to verify the correct patient, procedure, equipment, support staff and site/side marked as required. Type: abscess Body area: upper extremity Location details: left thumb Anesthesia: digital block Local anesthetic: lidocaine 2% without epinephrine Anesthetic total: 5 ml Patient sedated: no Scalpel size: 11 Complexity: complex Drainage: purulent Drainage amount: copious Wound treatment: wound left open Packing material: none Patient tolerance: Patient tolerated the procedure well with no immediate complications   (including critical care time) Labs Review Labs Reviewed  CBC WITH DIFFERENTIAL/PLATELET - Abnormal;  Notable for the following:    WBC 11.9 (*)    Neutro Abs 8.8 (*)    Monocytes Absolute 1.1 (*)    All other components within normal limits  BASIC METABOLIC PANEL - Abnormal; Notable for the following:    Glucose, Bld 126 (*)    All other components within normal limits  CBG MONITORING, ED - Abnormal; Notable for the following:    Glucose-Capillary 134 (*)    All other  components within normal limits    Imaging Review Ct Maxillofacial W/cm  07/01/2015  CLINICAL DATA:  Facial abscess.  Allergic reaction to antibiotic. EXAM: CT MAXILLOFACIAL WITH CONTRAST TECHNIQUE: Multidetector CT imaging of the maxillofacial structures was performed with intravenous contrast. Multiplanar CT image reconstructions were also generated. A small metallic BB was placed on the right temple in order to reliably differentiate right from left. CONTRAST:  75mL OMNIPAQUE IOHEXOL 300 MG/ML  SOLN COMPARISON:  None. FINDINGS: There is soft tissue swelling noted in the left cheek region medially near the left side of the nose. There is no focal fluid collection. Mild mucosal thickening in the paranasal sinuses without air-fluid level. Mastoid air cells are clear. Orbital soft tissues are unremarkable. No acute bony abnormality. IMPRESSION: Stranding within the left cheek soft tissues anteriorly without drainable fluid collection. Findings most compatible with cellulitis. Electronically Signed   By: Charlett Nose M.D.   On: 07/01/2015 15:44   I have personally reviewed and evaluated these images and lab results as part of my medical decision-making.    MDM   Final diagnoses:  Facial cellulitis  Paronychia of left thumb   Ariel Martinez presents with facial swelling and left thumb swelling.  CT maxillofacial shows left cheek cellulitis without drainable fluid collection. Patient is already taking Augmentin. Will add clindamycin.  Patient also with large paronychia of the left thumb. This was incised and drained without complication. Copious amounts of purulent fluid. Patient reports significant relief. Clindamycin and Augmentin will help cover these microbes as well.  Discussed abscess wound care including frequent soaking and flushing of the abscess and warm compresses for the cellulitis on her face. She has been given strict return precautions for extending infection, fevers, chills, nausea or  other concerns. She is to see her primary care physician in 2 days for wound check.  Finger splint as she has small grandchildren. This was given.  The patient was discussed with and seen by Dr. Cyndie Chime who agrees with the treatment plan.   BP 140/84 mmHg  Pulse 89  Temp(Src) 98.5 F (36.9 C) (Oral)  Resp [address], PA-C 07/01/15 1754  Leta Baptist, MD 07/05/15 [phone]

## 2015-07-01 NOTE — Discharge Instructions (Signed)
1. Medications: Clindamycin, usual home medications 2. Treatment: rest, drink plenty of fluids, use warm compresses, flush abscess with warm water several times per day 3. Follow Up: Please followup with your primary doctor in 2-3 days for wound check and further evaluation after today's visit; if you do not have a primary care doctor use the resource guide provided to find one; Please return to the ER for fevers, chills, nausea, vomiting or other signs of worsening infection    Paronychia Paronychia is an infection of the skin that surrounds a nail. It usually affects the skin around a fingernail, but it may also occur near a toenail. It often causes pain and swelling around the nail. This condition may come on suddenly or develop over a longer period. In some cases, a collection of pus (abscess) can form near or under the nail. Usually, paronychia is not serious and it clears up with treatment. CAUSES This condition may be caused by bacteria or fungi. It is commonly caused by either Streptococcus or Staphylococcus bacteria. The bacteria or fungi often cause the infection by getting into the affected area through an opening in the skin, such as a cut or a hangnail. RISK FACTORS This condition is more likely to develop in:  People who get their hands wet often, such as those who work as Fish farm manager, bartenders, or nurses.  People who bite their fingernails or suck their thumbs.  People who trim their nails too short.  People who have hangnails or injured fingertips.  People who get manicures.  People who have diabetes. SYMPTOMS Symptoms of this condition include:  Redness and swelling of the skin near the nail.  Tenderness around the nail when you touch the area.  Pus-filled bumps under the cuticle. The cuticle is the skin at the base or sides of the nail.  Fluid or pus under the nail.  Throbbing pain in the area. DIAGNOSIS This condition is usually diagnosed with a physical  exam. In some cases, a sample of pus may be taken from an abscess to be tested in a lab. This can help to determine what type of bacteria or fungi is causing the condition. TREATMENT Treatment for this condition depends on the cause and severity of the condition. If the condition is mild, it may clear up on its own in a few days. Your health care provider may recommend soaking the affected area in warm water a few times a day. When treatment is needed, the options may include:  Antibiotic medicine, if the condition is caused by a bacterial infection.  Antifungal medicine, if the condition is caused by a fungal infection.  Incision and drainage, if an abscess is present. In this procedure, the health care provider will cut open the abscess so the pus can drain out. HOME CARE INSTRUCTIONS  Soak the affected area in warm water if directed to do so by your health care provider. You may be told to do this for 20 minutes, 2-3 times a day. Keep the area dry in between soakings.  Take medicines only as directed by your health care provider.  If you were prescribed an antibiotic medicine, finish all of it even if you start to feel better.  Keep the affected area clean.  Do not try to drain a fluid-filled bump yourself.  If you will be washing dishes or performing other tasks that require your hands to get wet, wear rubber gloves. You should also wear gloves if your hands might come in contact with  irritating substances, such as cleaners or chemicals.  Follow your health care provider's instructions about:  Wound care.  Bandage (dressing) changes and removal. SEEK MEDICAL CARE IF:  Your symptoms get worse or do not improve with treatment.  You have a fever or chills.  You have redness spreading from the affected area.  You have continued or increased fluid, blood, or pus coming from the affected area.  Your finger or knuckle becomes swollen or is difficult to move.   This information is  not intended to replace advice given to you by your health care provider. Make sure you discuss any questions you have with your health care provider.   Document Released: 12/19/2000 Document Revised: 11/09/2014 Document Reviewed: 06/02/2014 Elsevier Interactive Patient Education [employer]2016 [employer]

## 2015-07-01 NOTE — ED Notes (Signed)
RN Elsie SaasJarrita starting and IV and getting labs

## 2015-07-05 DIAGNOSIS — J0100 Acute maxillary sinusitis, unspecified: Secondary | ICD-10-CM | POA: Diagnosis not present

## 2015-07-05 DIAGNOSIS — L03012 Cellulitis of left finger: Secondary | ICD-10-CM | POA: Diagnosis not present

## 2015-07-19 DIAGNOSIS — L03211 Cellulitis of face: Secondary | ICD-10-CM | POA: Diagnosis not present

## 2015-07-19 DIAGNOSIS — L03012 Cellulitis of left finger: Secondary | ICD-10-CM | POA: Diagnosis not present

## 2015-07-25 ENCOUNTER — Other Ambulatory Visit: Payer: Self-pay | Admitting: Family Medicine

## 2015-07-25 ENCOUNTER — Ambulatory Visit
Admission: RE | Admit: 2015-07-25 | Discharge: 2015-07-25 | Disposition: A | Discharge status: Home / Self Care | Payer: Commercial Managed Care - HMO | Source: Ambulatory Visit | Attending: Family Medicine | Admitting: Family Medicine

## 2015-07-25 DIAGNOSIS — R52 Pain, unspecified: Secondary | ICD-10-CM

## 2015-07-25 DIAGNOSIS — R609 Edema, unspecified: Secondary | ICD-10-CM

## 2015-07-25 DIAGNOSIS — M7989 Other specified soft tissue disorders: Secondary | ICD-10-CM | POA: Diagnosis not present

## 2015-07-27 DIAGNOSIS — L089 Local infection of the skin and subcutaneous tissue, unspecified: Secondary | ICD-10-CM | POA: Diagnosis not present

## 2015-07-27 DIAGNOSIS — Z6836 Body mass index (BMI) 36.0-36.9, adult: Secondary | ICD-10-CM | POA: Diagnosis not present

## 2015-07-27 DIAGNOSIS — L03211 Cellulitis of face: Secondary | ICD-10-CM | POA: Diagnosis not present

## 2015-08-30 DIAGNOSIS — M79645 Pain in left finger(s): Secondary | ICD-10-CM | POA: Diagnosis not present

## 2015-08-30 DIAGNOSIS — J449 Chronic obstructive pulmonary disease, unspecified: Secondary | ICD-10-CM | POA: Diagnosis not present

## 2015-10-31 DIAGNOSIS — I10 Essential (primary) hypertension: Secondary | ICD-10-CM | POA: Diagnosis not present

## 2015-10-31 DIAGNOSIS — J449 Chronic obstructive pulmonary disease, unspecified: Secondary | ICD-10-CM | POA: Diagnosis not present

## 2015-10-31 DIAGNOSIS — R600 Localized edema: Secondary | ICD-10-CM | POA: Diagnosis not present

## 2015-11-11 DIAGNOSIS — R600 Localized edema: Secondary | ICD-10-CM | POA: Diagnosis not present

## 2016-01-30 DIAGNOSIS — I10 Essential (primary) hypertension: Secondary | ICD-10-CM | POA: Diagnosis not present

## 2016-01-30 DIAGNOSIS — J449 Chronic obstructive pulmonary disease, unspecified: Secondary | ICD-10-CM | POA: Diagnosis not present

## 2016-05-30 DIAGNOSIS — E785 Hyperlipidemia, unspecified: Secondary | ICD-10-CM | POA: Diagnosis not present

## 2016-05-30 DIAGNOSIS — Z6834 Body mass index (BMI) 34.0-34.9, adult: Secondary | ICD-10-CM | POA: Diagnosis not present

## 2016-05-30 DIAGNOSIS — I10 Essential (primary) hypertension: Secondary | ICD-10-CM | POA: Diagnosis not present

## 2016-05-30 DIAGNOSIS — J449 Chronic obstructive pulmonary disease, unspecified: Secondary | ICD-10-CM | POA: Diagnosis not present

## 2016-09-03 DIAGNOSIS — R5383 Other fatigue: Secondary | ICD-10-CM | POA: Diagnosis not present

## 2016-09-03 DIAGNOSIS — I10 Essential (primary) hypertension: Secondary | ICD-10-CM | POA: Diagnosis not present

## 2016-09-03 DIAGNOSIS — J449 Chronic obstructive pulmonary disease, unspecified: Secondary | ICD-10-CM | POA: Diagnosis not present

## 2016-10-17 DIAGNOSIS — J449 Chronic obstructive pulmonary disease, unspecified: Secondary | ICD-10-CM | POA: Diagnosis not present

## 2016-10-17 DIAGNOSIS — E785 Hyperlipidemia, unspecified: Secondary | ICD-10-CM | POA: Diagnosis not present

## 2016-10-17 DIAGNOSIS — I10 Essential (primary) hypertension: Secondary | ICD-10-CM | POA: Diagnosis not present

## 2016-11-10 IMAGING — CT CT RENAL STONE PROTOCOL
2 of 4 series · 17 of 46 positions shown, 19 images · non-contrast
Comparison: None.

CLINICAL DATA: Nephrolithiasis.  Left flank pain.

EXAM:
CT ABDOMEN AND PELVIS WITHOUT CONTRAST
TECHNIQUE: Multidetector CT imaging of the abdomen and pelvis was performed
following the standard protocol without IV contrast.

[Series 2: standard/full over (age)lbs 5.0 · axial · 0.84mm/px · z∈[-418,-34]mm · 14 of 85 slices shown, 16 images]
[im 4/85  soft-tissue]
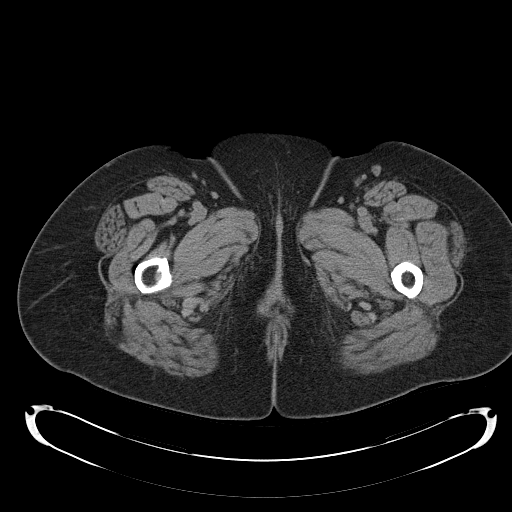
[im 4/85  bone]
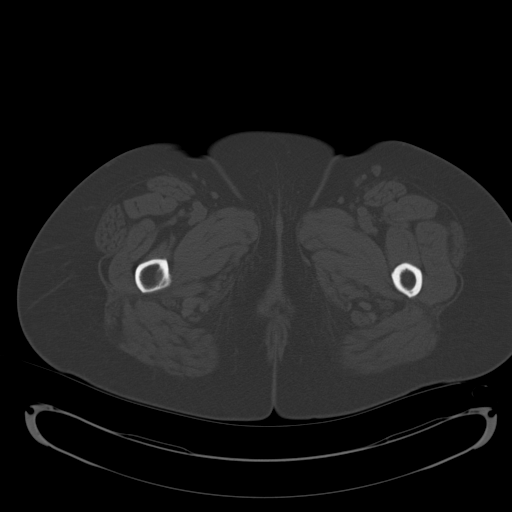
[im 11/85  soft-tissue]
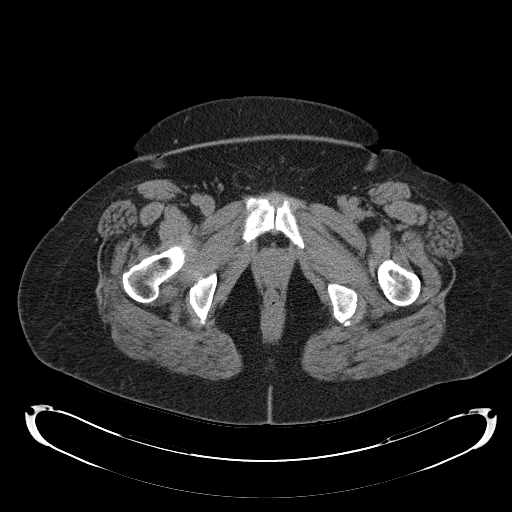
[im 15/85  soft-tissue]
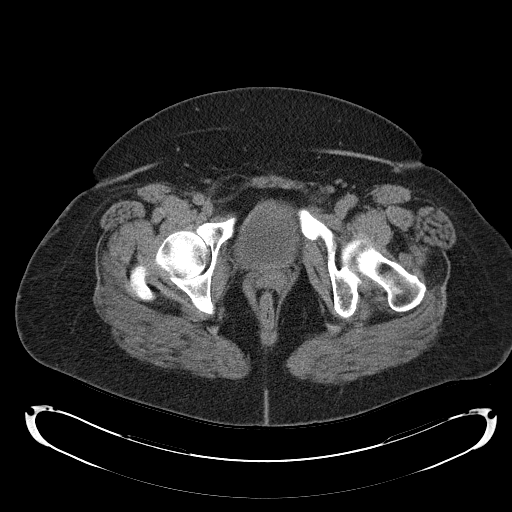
[im 22/85  soft-tissue]
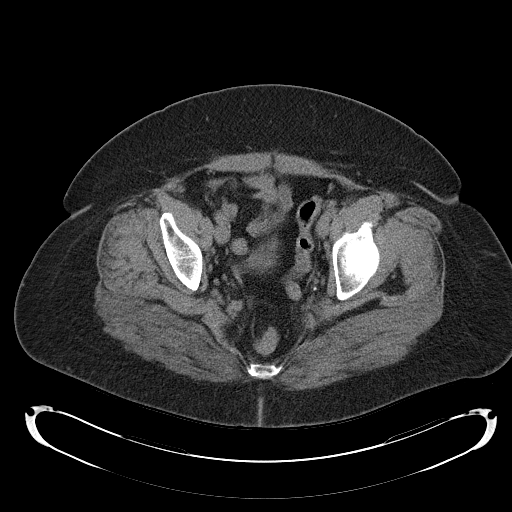
[im 30/85  soft-tissue]
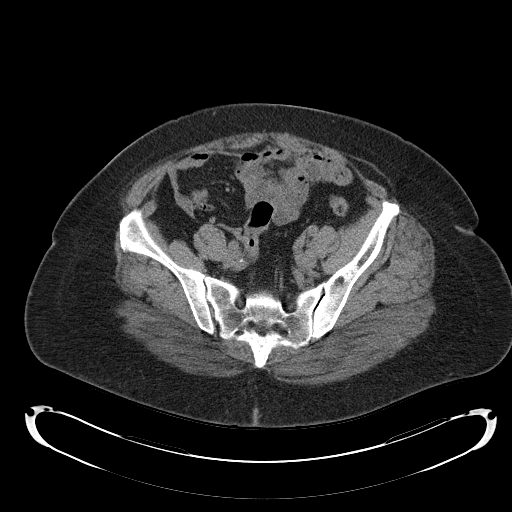
[im 33/85  soft-tissue]
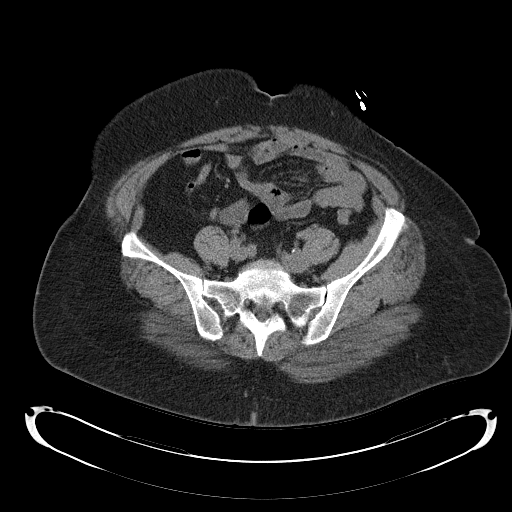
[im 41/85  soft-tissue]
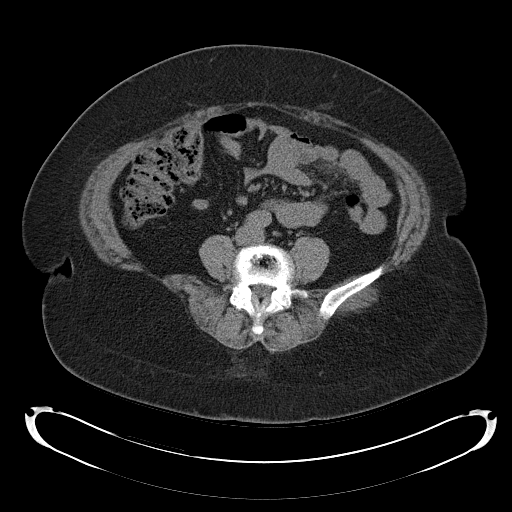
[im 44/85  soft-tissue]
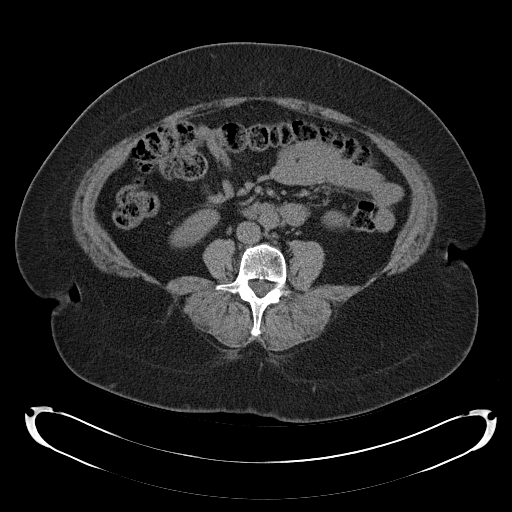
[im 52/85  soft-tissue]
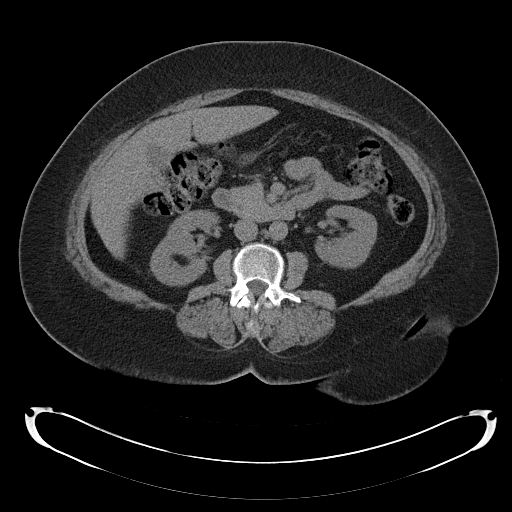
[im 52/85  bone]
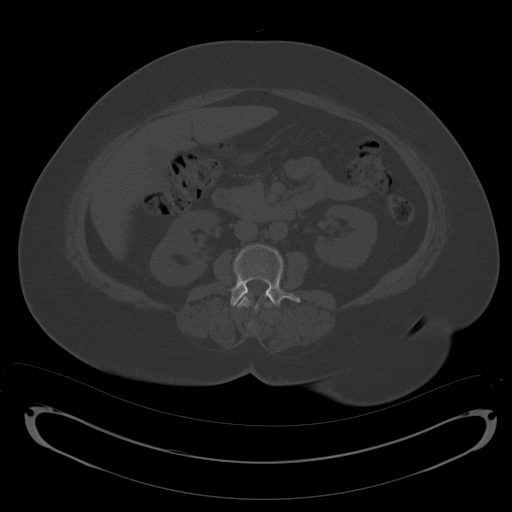
[im 55/85  soft-tissue]
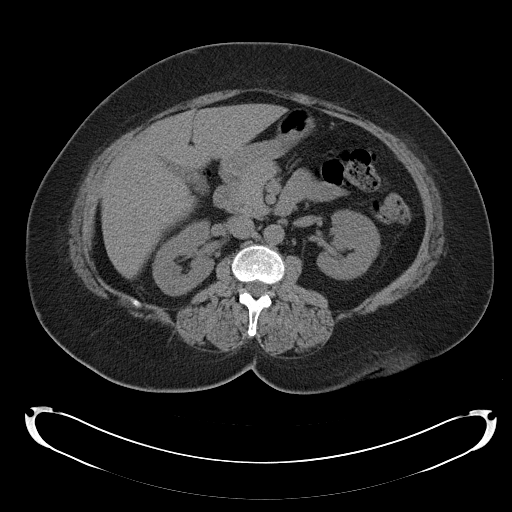
[im 63/85  soft-tissue]
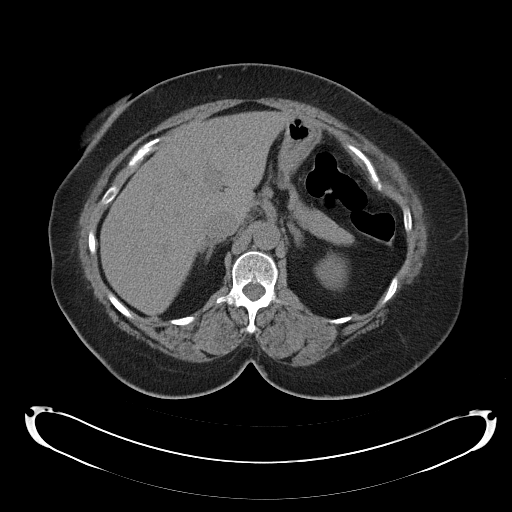
[im 70/85  soft-tissue]
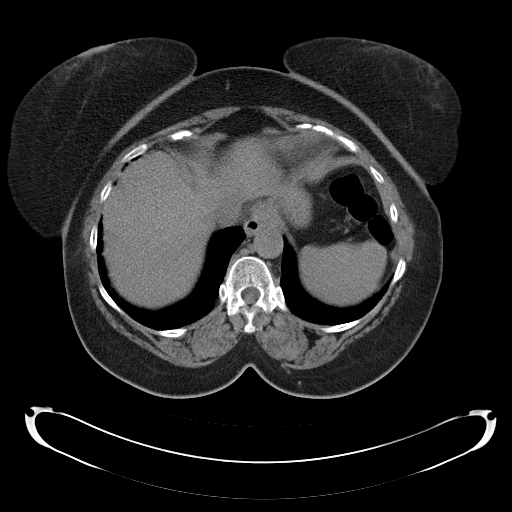
[im 74/85  soft-tissue]
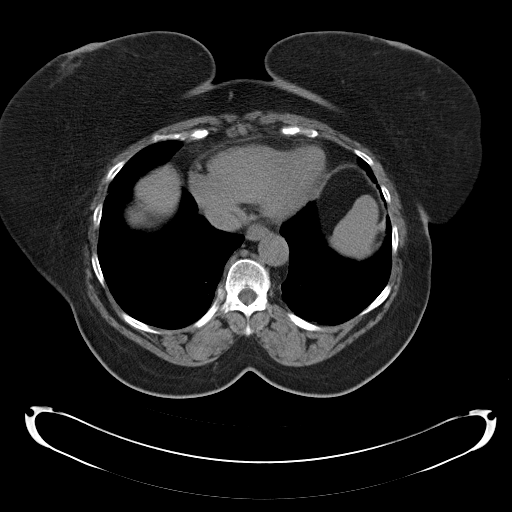
[im 81/85  soft-tissue]
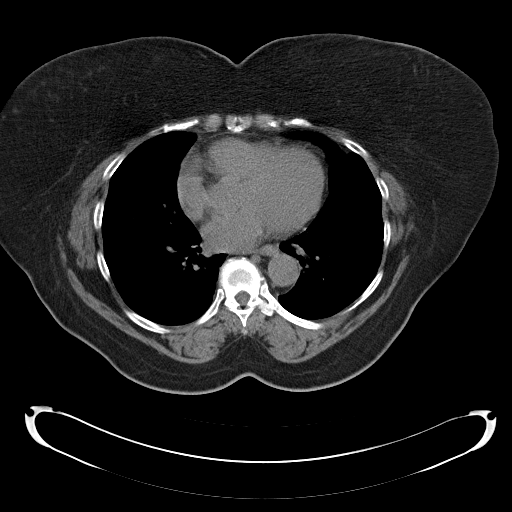

[Series 3: mpr coronal · coronal · 0.83mm/px · 3 of 112 slices shown]
[im 38/112  soft-tissue]
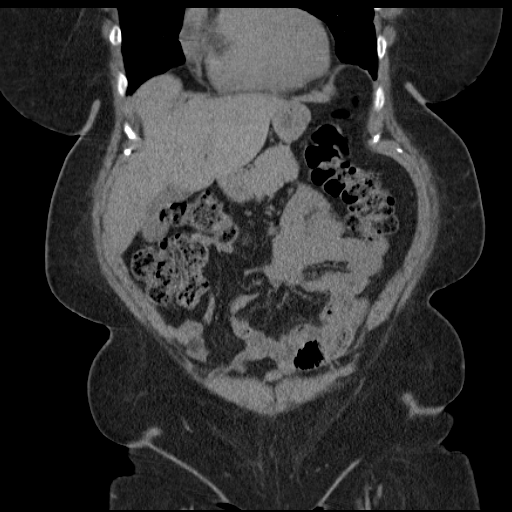
[im 50/112  soft-tissue]
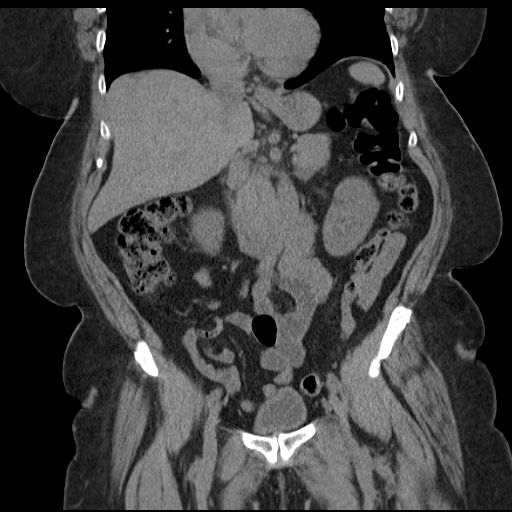
[im 62/112  soft-tissue]
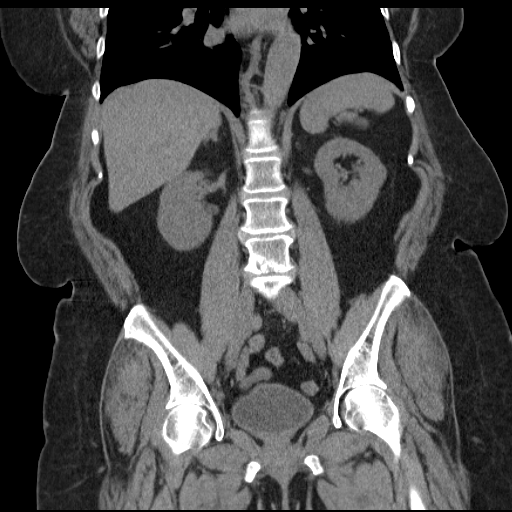

[17 of 46 positions shown; findings below may reference images not displayed]

FINDINGS: Lower chest: No pleural or pericardial effusion identified. Scarring
identified in the right middle lobe and lingula.

Hepatobiliary: There is no liver abnormality identified. The
gallbladder appears normal. No biliary dilatation.

Pancreas: The pancreas is unremarkable.

Spleen: The spleen appears normal.

Adrenals/Urinary Tract: The adrenal glands are both normal. Small
stone within the inferior pole of the right kidney measures 4 mm,
image 40/series 2. There are 2 stones within the inferior pole of
the left kidney. The largest measures 1 cm. The smaller stone
measures 3 mm. No obstructive uropathy identified. No ureteral
calculi. The urinary bladder appears normal.

Stomach/Bowel: The stomach is within normal limits. The small bowel
loops have a normal course and caliber. No obstruction. Normal
appearance of the colon.

Vascular/Lymphatic: Normal appearance of the abdominal aorta. No
enlarged retroperitoneal or mesenteric adenopathy. No enlarged
pelvic or inguinal lymph nodes.

Reproductive: Previous hysterectomy.  No adnexal mass.

Other: There is no ascites or focal fluid collections within the
abdomen or pelvis.

Musculoskeletal: There is degenerative disc disease noted at L3-4
and L4-5. No aggressive lytic or sclerotic bone lesions.
IMPRESSION: 1. No acute findings within the abdomen or pelvis.
2. Bilateral renal calculi.  No obstructive uropathy.
3. Lumbar degenerative disc disease.

## 2017-01-15 DIAGNOSIS — J449 Chronic obstructive pulmonary disease, unspecified: Secondary | ICD-10-CM | POA: Diagnosis not present

## 2017-01-15 DIAGNOSIS — I10 Essential (primary) hypertension: Secondary | ICD-10-CM | POA: Diagnosis not present

## 2017-04-15 DIAGNOSIS — I10 Essential (primary) hypertension: Secondary | ICD-10-CM | POA: Diagnosis not present

## 2017-04-15 DIAGNOSIS — E785 Hyperlipidemia, unspecified: Secondary | ICD-10-CM | POA: Diagnosis not present

## 2017-04-15 DIAGNOSIS — Z23 Encounter for immunization: Secondary | ICD-10-CM | POA: Diagnosis not present

## 2017-05-15 DIAGNOSIS — J449 Chronic obstructive pulmonary disease, unspecified: Secondary | ICD-10-CM | POA: Diagnosis not present

## 2017-05-15 DIAGNOSIS — I10 Essential (primary) hypertension: Secondary | ICD-10-CM | POA: Diagnosis not present

## 2017-07-11 DIAGNOSIS — J22 Unspecified acute lower respiratory infection: Secondary | ICD-10-CM | POA: Diagnosis not present

## 2017-07-24 DIAGNOSIS — J441 Chronic obstructive pulmonary disease with (acute) exacerbation: Secondary | ICD-10-CM | POA: Diagnosis not present

## 2017-10-22 DIAGNOSIS — I10 Essential (primary) hypertension: Secondary | ICD-10-CM | POA: Diagnosis not present

## 2017-10-22 DIAGNOSIS — E785 Hyperlipidemia, unspecified: Secondary | ICD-10-CM | POA: Diagnosis not present

## 2017-10-22 DIAGNOSIS — J449 Chronic obstructive pulmonary disease, unspecified: Secondary | ICD-10-CM | POA: Diagnosis not present

## 2018-01-29 DIAGNOSIS — J449 Chronic obstructive pulmonary disease, unspecified: Secondary | ICD-10-CM | POA: Diagnosis not present

## 2018-01-29 DIAGNOSIS — I10 Essential (primary) hypertension: Secondary | ICD-10-CM | POA: Diagnosis not present

## 2018-01-29 DIAGNOSIS — E785 Hyperlipidemia, unspecified: Secondary | ICD-10-CM | POA: Diagnosis not present

## 2018-02-04 DIAGNOSIS — Z1231 Encounter for screening mammogram for malignant neoplasm of breast: Secondary | ICD-10-CM | POA: Diagnosis not present

## 2018-03-29 DIAGNOSIS — E785 Hyperlipidemia, unspecified: Secondary | ICD-10-CM | POA: Diagnosis not present

## 2018-03-29 DIAGNOSIS — Z6836 Body mass index (BMI) 36.0-36.9, adult: Secondary | ICD-10-CM | POA: Diagnosis not present

## 2018-03-29 DIAGNOSIS — I10 Essential (primary) hypertension: Secondary | ICD-10-CM | POA: Diagnosis not present

## 2018-03-29 DIAGNOSIS — J449 Chronic obstructive pulmonary disease, unspecified: Secondary | ICD-10-CM | POA: Diagnosis not present

## 2018-06-03 DIAGNOSIS — R05 Cough: Secondary | ICD-10-CM | POA: Diagnosis not present

## 2018-07-01 DIAGNOSIS — J069 Acute upper respiratory infection, unspecified: Secondary | ICD-10-CM | POA: Diagnosis not present

## 2019-04-20 DIAGNOSIS — J449 Chronic obstructive pulmonary disease, unspecified: Secondary | ICD-10-CM | POA: Diagnosis not present

## 2019-04-20 DIAGNOSIS — I10 Essential (primary) hypertension: Secondary | ICD-10-CM | POA: Diagnosis not present

## 2019-04-20 DIAGNOSIS — E785 Hyperlipidemia, unspecified: Secondary | ICD-10-CM | POA: Diagnosis not present

## 2019-07-21 DIAGNOSIS — J449 Chronic obstructive pulmonary disease, unspecified: Secondary | ICD-10-CM | POA: Diagnosis not present

## 2019-07-21 DIAGNOSIS — I10 Essential (primary) hypertension: Secondary | ICD-10-CM | POA: Diagnosis not present

## 2019-08-31 DIAGNOSIS — J449 Chronic obstructive pulmonary disease, unspecified: Secondary | ICD-10-CM | POA: Diagnosis not present

## 2019-08-31 DIAGNOSIS — R600 Localized edema: Secondary | ICD-10-CM | POA: Diagnosis not present

## 2019-08-31 DIAGNOSIS — I10 Essential (primary) hypertension: Secondary | ICD-10-CM | POA: Diagnosis not present

## 2019-09-08 ENCOUNTER — Ambulatory Visit: Payer: Medicare HMO | Attending: Internal Medicine

## 2019-11-02 DIAGNOSIS — I10 Essential (primary) hypertension: Secondary | ICD-10-CM | POA: Diagnosis not present

## 2019-11-02 DIAGNOSIS — J449 Chronic obstructive pulmonary disease, unspecified: Secondary | ICD-10-CM | POA: Diagnosis not present

## 2019-11-02 DIAGNOSIS — Z1152 Encounter for screening for COVID-19: Secondary | ICD-10-CM | POA: Diagnosis not present

## 2019-12-07 DIAGNOSIS — I10 Essential (primary) hypertension: Secondary | ICD-10-CM | POA: Diagnosis not present

## 2019-12-07 DIAGNOSIS — J449 Chronic obstructive pulmonary disease, unspecified: Secondary | ICD-10-CM | POA: Diagnosis not present

## 2019-12-28 DIAGNOSIS — R600 Localized edema: Secondary | ICD-10-CM | POA: Diagnosis not present

## 2019-12-28 DIAGNOSIS — I10 Essential (primary) hypertension: Secondary | ICD-10-CM | POA: Diagnosis not present

## 2019-12-28 DIAGNOSIS — J449 Chronic obstructive pulmonary disease, unspecified: Secondary | ICD-10-CM | POA: Diagnosis not present

## 2020-03-21 DIAGNOSIS — E785 Hyperlipidemia, unspecified: Secondary | ICD-10-CM | POA: Diagnosis not present

## 2020-03-21 DIAGNOSIS — Z0000 Encounter for general adult medical examination without abnormal findings: Secondary | ICD-10-CM | POA: Diagnosis not present

## 2020-03-21 DIAGNOSIS — J449 Chronic obstructive pulmonary disease, unspecified: Secondary | ICD-10-CM | POA: Diagnosis not present

## 2020-03-21 DIAGNOSIS — I10 Essential (primary) hypertension: Secondary | ICD-10-CM | POA: Diagnosis not present

## 2020-04-07 DIAGNOSIS — I10 Essential (primary) hypertension: Secondary | ICD-10-CM | POA: Diagnosis not present

## 2020-04-07 DIAGNOSIS — J449 Chronic obstructive pulmonary disease, unspecified: Secondary | ICD-10-CM | POA: Diagnosis not present

## 2020-04-07 DIAGNOSIS — E785 Hyperlipidemia, unspecified: Secondary | ICD-10-CM | POA: Diagnosis not present

## 2020-06-20 DIAGNOSIS — E785 Hyperlipidemia, unspecified: Secondary | ICD-10-CM | POA: Diagnosis not present

## 2020-06-20 DIAGNOSIS — J449 Chronic obstructive pulmonary disease, unspecified: Secondary | ICD-10-CM | POA: Diagnosis not present

## 2020-06-20 DIAGNOSIS — I10 Essential (primary) hypertension: Secondary | ICD-10-CM | POA: Diagnosis not present

## 2020-06-21 DIAGNOSIS — J449 Chronic obstructive pulmonary disease, unspecified: Secondary | ICD-10-CM | POA: Diagnosis not present

## 2020-06-21 DIAGNOSIS — E785 Hyperlipidemia, unspecified: Secondary | ICD-10-CM | POA: Diagnosis not present

## 2020-06-21 DIAGNOSIS — I10 Essential (primary) hypertension: Secondary | ICD-10-CM | POA: Diagnosis not present

## 2020-09-05 DIAGNOSIS — I10 Essential (primary) hypertension: Secondary | ICD-10-CM | POA: Diagnosis not present

## 2020-09-05 DIAGNOSIS — J449 Chronic obstructive pulmonary disease, unspecified: Secondary | ICD-10-CM | POA: Diagnosis not present

## 2020-09-05 DIAGNOSIS — E785 Hyperlipidemia, unspecified: Secondary | ICD-10-CM | POA: Diagnosis not present

## 2020-09-20 DIAGNOSIS — R600 Localized edema: Secondary | ICD-10-CM | POA: Diagnosis not present

## 2020-09-20 DIAGNOSIS — J449 Chronic obstructive pulmonary disease, unspecified: Secondary | ICD-10-CM | POA: Diagnosis not present

## 2020-09-20 DIAGNOSIS — E789 Disorder of lipoprotein metabolism, unspecified: Secondary | ICD-10-CM | POA: Diagnosis not present

## 2020-09-20 DIAGNOSIS — E785 Hyperlipidemia, unspecified: Secondary | ICD-10-CM | POA: Diagnosis not present

## 2020-09-20 DIAGNOSIS — I10 Essential (primary) hypertension: Secondary | ICD-10-CM | POA: Diagnosis not present

## 2020-11-07 DIAGNOSIS — U071 COVID-19: Secondary | ICD-10-CM | POA: Diagnosis not present

## 2020-12-06 DIAGNOSIS — R600 Localized edema: Secondary | ICD-10-CM | POA: Diagnosis not present

## 2020-12-06 DIAGNOSIS — J449 Chronic obstructive pulmonary disease, unspecified: Secondary | ICD-10-CM | POA: Diagnosis not present

## 2020-12-06 DIAGNOSIS — I10 Essential (primary) hypertension: Secondary | ICD-10-CM | POA: Diagnosis not present

## 2020-12-06 DIAGNOSIS — E785 Hyperlipidemia, unspecified: Secondary | ICD-10-CM | POA: Diagnosis not present

## 2021-01-05 DIAGNOSIS — I10 Essential (primary) hypertension: Secondary | ICD-10-CM | POA: Diagnosis not present

## 2021-01-05 DIAGNOSIS — E785 Hyperlipidemia, unspecified: Secondary | ICD-10-CM | POA: Diagnosis not present

## 2021-01-05 DIAGNOSIS — J449 Chronic obstructive pulmonary disease, unspecified: Secondary | ICD-10-CM | POA: Diagnosis not present

## 2021-02-18 DIAGNOSIS — J449 Chronic obstructive pulmonary disease, unspecified: Secondary | ICD-10-CM | POA: Diagnosis not present

## 2021-02-18 DIAGNOSIS — I10 Essential (primary) hypertension: Secondary | ICD-10-CM | POA: Diagnosis not present

## 2021-02-18 DIAGNOSIS — R600 Localized edema: Secondary | ICD-10-CM | POA: Diagnosis not present

## 2021-03-02 DIAGNOSIS — I10 Essential (primary) hypertension: Secondary | ICD-10-CM | POA: Diagnosis not present

## 2021-03-02 DIAGNOSIS — S63272A Dislocation of unspecified interphalangeal joint of right middle finger, initial encounter: Secondary | ICD-10-CM | POA: Diagnosis not present

## 2021-03-02 DIAGNOSIS — G8911 Acute pain due to trauma: Secondary | ICD-10-CM | POA: Diagnosis not present

## 2021-03-02 DIAGNOSIS — S63282A Dislocation of proximal interphalangeal joint of right middle finger, initial encounter: Secondary | ICD-10-CM | POA: Diagnosis not present

## 2021-03-02 DIAGNOSIS — S63281A Dislocation of proximal interphalangeal joint of left index finger, initial encounter: Secondary | ICD-10-CM | POA: Diagnosis not present

## 2021-03-08 DIAGNOSIS — E785 Hyperlipidemia, unspecified: Secondary | ICD-10-CM | POA: Diagnosis not present

## 2021-03-08 DIAGNOSIS — I10 Essential (primary) hypertension: Secondary | ICD-10-CM | POA: Diagnosis not present

## 2021-03-08 DIAGNOSIS — J449 Chronic obstructive pulmonary disease, unspecified: Secondary | ICD-10-CM | POA: Diagnosis not present

## 2021-04-07 DIAGNOSIS — E785 Hyperlipidemia, unspecified: Secondary | ICD-10-CM | POA: Diagnosis not present

## 2021-04-07 DIAGNOSIS — I10 Essential (primary) hypertension: Secondary | ICD-10-CM | POA: Diagnosis not present

## 2021-04-07 DIAGNOSIS — J449 Chronic obstructive pulmonary disease, unspecified: Secondary | ICD-10-CM | POA: Diagnosis not present

## 2021-04-10 DIAGNOSIS — M79644 Pain in right finger(s): Secondary | ICD-10-CM | POA: Diagnosis not present

## 2021-04-10 DIAGNOSIS — Z0000 Encounter for general adult medical examination without abnormal findings: Secondary | ICD-10-CM | POA: Diagnosis not present

## 2021-04-10 DIAGNOSIS — N390 Urinary tract infection, site not specified: Secondary | ICD-10-CM | POA: Diagnosis not present

## 2021-04-25 DIAGNOSIS — S6981XA Other specified injuries of right wrist, hand and finger(s), initial encounter: Secondary | ICD-10-CM | POA: Diagnosis not present

## 2021-04-25 DIAGNOSIS — S6991XA Unspecified injury of right wrist, hand and finger(s), initial encounter: Secondary | ICD-10-CM | POA: Diagnosis not present

## 2021-04-25 DIAGNOSIS — M79641 Pain in right hand: Secondary | ICD-10-CM | POA: Diagnosis not present

## 2021-04-25 DIAGNOSIS — S62654A Nondisplaced fracture of medial phalanx of right ring finger, initial encounter for closed fracture: Secondary | ICD-10-CM | POA: Diagnosis not present

## 2021-05-04 DIAGNOSIS — S6991XD Unspecified injury of right wrist, hand and finger(s), subsequent encounter: Secondary | ICD-10-CM | POA: Diagnosis not present

## 2021-05-04 DIAGNOSIS — S6991XA Unspecified injury of right wrist, hand and finger(s), initial encounter: Secondary | ICD-10-CM | POA: Diagnosis not present

## 2021-05-04 DIAGNOSIS — M25541 Pain in joints of right hand: Secondary | ICD-10-CM | POA: Diagnosis not present

## 2021-05-04 DIAGNOSIS — S62654D Nondisplaced fracture of medial phalanx of right ring finger, subsequent encounter for fracture with routine healing: Secondary | ICD-10-CM | POA: Diagnosis not present

## 2021-05-04 DIAGNOSIS — S62654A Nondisplaced fracture of medial phalanx of right ring finger, initial encounter for closed fracture: Secondary | ICD-10-CM | POA: Diagnosis not present

## 2021-05-09 DIAGNOSIS — M79644 Pain in right finger(s): Secondary | ICD-10-CM | POA: Diagnosis not present

## 2021-05-11 DIAGNOSIS — R52 Pain, unspecified: Secondary | ICD-10-CM | POA: Diagnosis not present

## 2021-05-11 DIAGNOSIS — R609 Edema, unspecified: Secondary | ICD-10-CM | POA: Diagnosis not present

## 2021-05-11 DIAGNOSIS — S62654S Nondisplaced fracture of medial phalanx of right ring finger, sequela: Secondary | ICD-10-CM | POA: Diagnosis not present

## 2021-05-11 DIAGNOSIS — M25641 Stiffness of right hand, not elsewhere classified: Secondary | ICD-10-CM | POA: Diagnosis not present

## 2021-05-11 DIAGNOSIS — S6991XS Unspecified injury of right wrist, hand and finger(s), sequela: Secondary | ICD-10-CM | POA: Diagnosis not present

## 2021-05-17 DIAGNOSIS — S62654D Nondisplaced fracture of medial phalanx of right ring finger, subsequent encounter for fracture with routine healing: Secondary | ICD-10-CM | POA: Diagnosis not present

## 2021-05-17 DIAGNOSIS — S6991XD Unspecified injury of right wrist, hand and finger(s), subsequent encounter: Secondary | ICD-10-CM | POA: Diagnosis not present

## 2021-05-29 DIAGNOSIS — M25641 Stiffness of right hand, not elsewhere classified: Secondary | ICD-10-CM | POA: Diagnosis not present

## 2021-05-29 DIAGNOSIS — R52 Pain, unspecified: Secondary | ICD-10-CM | POA: Diagnosis not present

## 2021-05-29 DIAGNOSIS — R609 Edema, unspecified: Secondary | ICD-10-CM | POA: Diagnosis not present

## 2021-05-29 DIAGNOSIS — S62654D Nondisplaced fracture of medial phalanx of right ring finger, subsequent encounter for fracture with routine healing: Secondary | ICD-10-CM | POA: Diagnosis not present

## 2021-05-29 DIAGNOSIS — S6991XD Unspecified injury of right wrist, hand and finger(s), subsequent encounter: Secondary | ICD-10-CM | POA: Diagnosis not present

## 2021-06-07 DIAGNOSIS — E785 Hyperlipidemia, unspecified: Secondary | ICD-10-CM | POA: Diagnosis not present

## 2021-06-07 DIAGNOSIS — I10 Essential (primary) hypertension: Secondary | ICD-10-CM | POA: Diagnosis not present

## 2021-06-07 DIAGNOSIS — J449 Chronic obstructive pulmonary disease, unspecified: Secondary | ICD-10-CM | POA: Diagnosis not present

## 2021-06-20 DIAGNOSIS — M79644 Pain in right finger(s): Secondary | ICD-10-CM | POA: Diagnosis not present

## 2021-06-28 DIAGNOSIS — S62654D Nondisplaced fracture of medial phalanx of right ring finger, subsequent encounter for fracture with routine healing: Secondary | ICD-10-CM | POA: Diagnosis not present

## 2021-06-28 DIAGNOSIS — S6991XD Unspecified injury of right wrist, hand and finger(s), subsequent encounter: Secondary | ICD-10-CM | POA: Diagnosis not present

## 2021-07-06 DIAGNOSIS — S62654D Nondisplaced fracture of medial phalanx of right ring finger, subsequent encounter for fracture with routine healing: Secondary | ICD-10-CM | POA: Diagnosis not present

## 2021-07-06 DIAGNOSIS — M25641 Stiffness of right hand, not elsewhere classified: Secondary | ICD-10-CM | POA: Diagnosis not present

## 2021-07-06 DIAGNOSIS — R609 Edema, unspecified: Secondary | ICD-10-CM | POA: Diagnosis not present

## 2021-07-06 DIAGNOSIS — R29898 Other symptoms and signs involving the musculoskeletal system: Secondary | ICD-10-CM | POA: Diagnosis not present

## 2021-07-06 DIAGNOSIS — S6991XD Unspecified injury of right wrist, hand and finger(s), subsequent encounter: Secondary | ICD-10-CM | POA: Diagnosis not present

## 2021-07-06 DIAGNOSIS — R52 Pain, unspecified: Secondary | ICD-10-CM | POA: Diagnosis not present

## 2021-07-13 DIAGNOSIS — S6991XD Unspecified injury of right wrist, hand and finger(s), subsequent encounter: Secondary | ICD-10-CM | POA: Diagnosis not present

## 2021-07-13 DIAGNOSIS — S62654D Nondisplaced fracture of medial phalanx of right ring finger, subsequent encounter for fracture with routine healing: Secondary | ICD-10-CM | POA: Diagnosis not present

## 2021-07-13 DIAGNOSIS — M25641 Stiffness of right hand, not elsewhere classified: Secondary | ICD-10-CM | POA: Diagnosis not present

## 2021-07-13 DIAGNOSIS — R52 Pain, unspecified: Secondary | ICD-10-CM | POA: Diagnosis not present

## 2021-08-06 DIAGNOSIS — I10 Essential (primary) hypertension: Secondary | ICD-10-CM | POA: Diagnosis not present

## 2021-08-06 DIAGNOSIS — J449 Chronic obstructive pulmonary disease, unspecified: Secondary | ICD-10-CM | POA: Diagnosis not present

## 2021-08-06 DIAGNOSIS — E785 Hyperlipidemia, unspecified: Secondary | ICD-10-CM | POA: Diagnosis not present

## 2021-09-12 DIAGNOSIS — S6991XD Unspecified injury of right wrist, hand and finger(s), subsequent encounter: Secondary | ICD-10-CM | POA: Diagnosis not present

## 2021-09-12 DIAGNOSIS — S62654D Nondisplaced fracture of medial phalanx of right ring finger, subsequent encounter for fracture with routine healing: Secondary | ICD-10-CM | POA: Diagnosis not present

## 2021-10-06 DIAGNOSIS — I10 Essential (primary) hypertension: Secondary | ICD-10-CM | POA: Diagnosis not present

## 2021-10-06 DIAGNOSIS — J449 Chronic obstructive pulmonary disease, unspecified: Secondary | ICD-10-CM | POA: Diagnosis not present

## 2021-10-06 DIAGNOSIS — E785 Hyperlipidemia, unspecified: Secondary | ICD-10-CM | POA: Diagnosis not present

## 2021-10-09 DIAGNOSIS — I10 Essential (primary) hypertension: Secondary | ICD-10-CM | POA: Diagnosis not present

## 2021-10-09 DIAGNOSIS — M79644 Pain in right finger(s): Secondary | ICD-10-CM | POA: Diagnosis not present

## 2021-12-11 DIAGNOSIS — I10 Essential (primary) hypertension: Secondary | ICD-10-CM | POA: Diagnosis not present

## 2021-12-11 DIAGNOSIS — E785 Hyperlipidemia, unspecified: Secondary | ICD-10-CM | POA: Diagnosis not present

## 2021-12-11 DIAGNOSIS — M79644 Pain in right finger(s): Secondary | ICD-10-CM | POA: Diagnosis not present

## 2021-12-11 DIAGNOSIS — Z6836 Body mass index (BMI) 36.0-36.9, adult: Secondary | ICD-10-CM | POA: Diagnosis not present

## 2022-04-03 DIAGNOSIS — E785 Hyperlipidemia, unspecified: Secondary | ICD-10-CM | POA: Diagnosis not present

## 2022-04-03 DIAGNOSIS — R600 Localized edema: Secondary | ICD-10-CM | POA: Diagnosis not present

## 2022-04-03 DIAGNOSIS — I10 Essential (primary) hypertension: Secondary | ICD-10-CM | POA: Diagnosis not present

## 2022-04-03 DIAGNOSIS — J449 Chronic obstructive pulmonary disease, unspecified: Secondary | ICD-10-CM | POA: Diagnosis not present

## 2022-05-12 DIAGNOSIS — R600 Localized edema: Secondary | ICD-10-CM | POA: Diagnosis not present

## 2022-05-12 DIAGNOSIS — I872 Venous insufficiency (chronic) (peripheral): Secondary | ICD-10-CM | POA: Diagnosis not present

## 2022-05-25 DIAGNOSIS — I10 Essential (primary) hypertension: Secondary | ICD-10-CM | POA: Diagnosis not present

## 2022-05-25 DIAGNOSIS — E663 Overweight: Secondary | ICD-10-CM | POA: Diagnosis not present

## 2022-05-25 DIAGNOSIS — Z0000 Encounter for general adult medical examination without abnormal findings: Secondary | ICD-10-CM | POA: Diagnosis not present

## 2022-05-25 DIAGNOSIS — J449 Chronic obstructive pulmonary disease, unspecified: Secondary | ICD-10-CM | POA: Diagnosis not present

## 2022-05-25 DIAGNOSIS — I872 Venous insufficiency (chronic) (peripheral): Secondary | ICD-10-CM | POA: Diagnosis not present

## 2022-05-25 DIAGNOSIS — J219 Acute bronchiolitis, unspecified: Secondary | ICD-10-CM | POA: Diagnosis not present

## 2022-06-07 DIAGNOSIS — E785 Hyperlipidemia, unspecified: Secondary | ICD-10-CM | POA: Diagnosis not present

## 2022-06-07 DIAGNOSIS — J449 Chronic obstructive pulmonary disease, unspecified: Secondary | ICD-10-CM | POA: Diagnosis not present

## 2022-06-07 DIAGNOSIS — I10 Essential (primary) hypertension: Secondary | ICD-10-CM | POA: Diagnosis not present

## 2022-06-12 DIAGNOSIS — Z780 Asymptomatic menopausal state: Secondary | ICD-10-CM | POA: Diagnosis not present

## 2022-06-12 DIAGNOSIS — Z1231 Encounter for screening mammogram for malignant neoplasm of breast: Secondary | ICD-10-CM | POA: Diagnosis not present

## 2022-06-12 DIAGNOSIS — M8589 Other specified disorders of bone density and structure, multiple sites: Secondary | ICD-10-CM | POA: Diagnosis not present

## 2022-07-04 DIAGNOSIS — J219 Acute bronchiolitis, unspecified: Secondary | ICD-10-CM | POA: Diagnosis not present

## 2022-07-04 DIAGNOSIS — M8580 Other specified disorders of bone density and structure, unspecified site: Secondary | ICD-10-CM | POA: Diagnosis not present

## 2022-07-04 DIAGNOSIS — J449 Chronic obstructive pulmonary disease, unspecified: Secondary | ICD-10-CM | POA: Diagnosis not present

## 2022-07-04 DIAGNOSIS — I10 Essential (primary) hypertension: Secondary | ICD-10-CM | POA: Diagnosis not present

## 2022-09-18 DIAGNOSIS — E7800 Pure hypercholesterolemia, unspecified: Secondary | ICD-10-CM | POA: Diagnosis not present

## 2022-09-18 DIAGNOSIS — M8580 Other specified disorders of bone density and structure, unspecified site: Secondary | ICD-10-CM | POA: Diagnosis not present

## 2022-09-18 DIAGNOSIS — J449 Chronic obstructive pulmonary disease, unspecified: Secondary | ICD-10-CM | POA: Diagnosis not present

## 2022-09-18 DIAGNOSIS — E663 Overweight: Secondary | ICD-10-CM | POA: Diagnosis not present

## 2022-09-18 DIAGNOSIS — E782 Mixed hyperlipidemia: Secondary | ICD-10-CM | POA: Diagnosis not present

## 2022-09-18 DIAGNOSIS — I10 Essential (primary) hypertension: Secondary | ICD-10-CM | POA: Diagnosis not present

## 2022-09-18 DIAGNOSIS — I872 Venous insufficiency (chronic) (peripheral): Secondary | ICD-10-CM | POA: Diagnosis not present

## 2023-06-18 DIAGNOSIS — Z1231 Encounter for screening mammogram for malignant neoplasm of breast: Secondary | ICD-10-CM | POA: Diagnosis not present

## 2023-09-20 ENCOUNTER — Other Ambulatory Visit: Payer: Self-pay | Admitting: Family Medicine

## 2023-09-20 ENCOUNTER — Ambulatory Visit
Admission: RE | Admit: 2023-09-20 | Discharge: 2023-09-20 | Disposition: A | Discharge status: Home / Self Care | Source: Ambulatory Visit | Attending: Family Medicine | Admitting: Family Medicine

## 2023-09-20 DIAGNOSIS — J449 Chronic obstructive pulmonary disease, unspecified: Secondary | ICD-10-CM | POA: Diagnosis not present

## 2023-09-20 DIAGNOSIS — J45909 Unspecified asthma, uncomplicated: Secondary | ICD-10-CM | POA: Diagnosis not present

## 2023-09-20 DIAGNOSIS — Z6837 Body mass index (BMI) 37.0-37.9, adult: Secondary | ICD-10-CM | POA: Diagnosis not present

## 2023-09-20 DIAGNOSIS — M1991 Primary osteoarthritis, unspecified site: Secondary | ICD-10-CM | POA: Diagnosis not present

## 2023-09-20 DIAGNOSIS — E782 Mixed hyperlipidemia: Secondary | ICD-10-CM | POA: Diagnosis not present

## 2023-09-20 DIAGNOSIS — I10 Essential (primary) hypertension: Secondary | ICD-10-CM | POA: Diagnosis not present

## 2023-09-20 DIAGNOSIS — M25512 Pain in left shoulder: Secondary | ICD-10-CM

## 2023-09-20 DIAGNOSIS — R7303 Prediabetes: Secondary | ICD-10-CM | POA: Diagnosis not present

## 2023-10-02 DIAGNOSIS — J449 Chronic obstructive pulmonary disease, unspecified: Secondary | ICD-10-CM | POA: Diagnosis not present

## 2023-10-02 DIAGNOSIS — E782 Mixed hyperlipidemia: Secondary | ICD-10-CM | POA: Diagnosis not present

## 2023-10-02 DIAGNOSIS — M19212 Secondary osteoarthritis, left shoulder: Secondary | ICD-10-CM | POA: Diagnosis not present

## 2023-10-02 DIAGNOSIS — E1122 Type 2 diabetes mellitus with diabetic chronic kidney disease: Secondary | ICD-10-CM | POA: Diagnosis not present

## 2023-10-02 DIAGNOSIS — N181 Chronic kidney disease, stage 1: Secondary | ICD-10-CM | POA: Diagnosis not present

## 2023-11-30 DIAGNOSIS — J209 Acute bronchitis, unspecified: Secondary | ICD-10-CM | POA: Diagnosis not present

## 2023-11-30 DIAGNOSIS — R062 Wheezing: Secondary | ICD-10-CM | POA: Diagnosis not present

## 2023-12-28 DIAGNOSIS — R062 Wheezing: Secondary | ICD-10-CM | POA: Diagnosis not present

## 2023-12-28 DIAGNOSIS — J22 Unspecified acute lower respiratory infection: Secondary | ICD-10-CM | POA: Diagnosis not present

## 2023-12-28 DIAGNOSIS — I10 Essential (primary) hypertension: Secondary | ICD-10-CM | POA: Diagnosis not present

## 2023-12-28 DIAGNOSIS — E669 Obesity, unspecified: Secondary | ICD-10-CM | POA: Diagnosis not present

## 2023-12-28 DIAGNOSIS — Z6837 Body mass index (BMI) 37.0-37.9, adult: Secondary | ICD-10-CM | POA: Diagnosis not present

## 2024-01-01 DIAGNOSIS — J45909 Unspecified asthma, uncomplicated: Secondary | ICD-10-CM | POA: Diagnosis not present

## 2024-01-01 DIAGNOSIS — E1169 Type 2 diabetes mellitus with other specified complication: Secondary | ICD-10-CM | POA: Diagnosis not present

## 2024-01-01 DIAGNOSIS — I10 Essential (primary) hypertension: Secondary | ICD-10-CM | POA: Diagnosis not present

## 2024-01-01 DIAGNOSIS — E6609 Other obesity due to excess calories: Secondary | ICD-10-CM | POA: Diagnosis not present

## 2024-03-04 DIAGNOSIS — E1165 Type 2 diabetes mellitus with hyperglycemia: Secondary | ICD-10-CM | POA: Diagnosis not present

## 2024-03-04 DIAGNOSIS — E119 Type 2 diabetes mellitus without complications: Secondary | ICD-10-CM | POA: Diagnosis not present

## 2024-03-04 DIAGNOSIS — E782 Mixed hyperlipidemia: Secondary | ICD-10-CM | POA: Diagnosis not present

## 2024-03-04 DIAGNOSIS — I10 Essential (primary) hypertension: Secondary | ICD-10-CM | POA: Diagnosis not present

## 2024-03-04 DIAGNOSIS — J390 Retropharyngeal and parapharyngeal abscess: Secondary | ICD-10-CM | POA: Diagnosis not present

## 2024-03-04 DIAGNOSIS — J45909 Unspecified asthma, uncomplicated: Secondary | ICD-10-CM | POA: Diagnosis not present

## 2024-03-18 DIAGNOSIS — I10 Essential (primary) hypertension: Secondary | ICD-10-CM | POA: Diagnosis not present

## 2024-03-18 DIAGNOSIS — E1165 Type 2 diabetes mellitus with hyperglycemia: Secondary | ICD-10-CM | POA: Diagnosis not present

## 2024-03-18 DIAGNOSIS — E782 Mixed hyperlipidemia: Secondary | ICD-10-CM | POA: Diagnosis not present

## 2024-04-22 DIAGNOSIS — E1169 Type 2 diabetes mellitus with other specified complication: Secondary | ICD-10-CM | POA: Diagnosis not present

## 2024-04-22 DIAGNOSIS — I10 Essential (primary) hypertension: Secondary | ICD-10-CM | POA: Diagnosis not present

## 2024-05-20 DIAGNOSIS — E119 Type 2 diabetes mellitus without complications: Secondary | ICD-10-CM | POA: Diagnosis not present

## 2024-05-20 DIAGNOSIS — E782 Mixed hyperlipidemia: Secondary | ICD-10-CM | POA: Diagnosis not present

## 2024-05-20 DIAGNOSIS — I10 Essential (primary) hypertension: Secondary | ICD-10-CM | POA: Diagnosis not present

## 2024-08-11 ENCOUNTER — Encounter: Payer: Self-pay | Admitting: *Deleted

## 2024-08-11 NOTE — Progress Notes (Signed)
 Ariel Martinez                                          MRN: [id number]   08/11/2024   The [facility] Quality Team Specialist reviewed this patient medical record for the purposes of chart review for care gap closure. The following were reviewed: chart review for care gap closure-controlling blood pressure.    [facility] Quality Team
